# Patient Record
Sex: Female | Born: 1965
Health system: Southern US, Community
[De-identification: ages and names within clinical notes are randomized; demographics above are authoritative.]

## PROBLEM LIST (undated history)

## (undated) DIAGNOSIS — D259 Leiomyoma of uterus, unspecified: Secondary | ICD-10-CM

## (undated) DIAGNOSIS — Z973 Presence of spectacles and contact lenses: Secondary | ICD-10-CM

## (undated) DIAGNOSIS — T7840XA Allergy, unspecified, initial encounter: Secondary | ICD-10-CM

## (undated) DIAGNOSIS — Z9889 Other specified postprocedural states: Secondary | ICD-10-CM

## (undated) DIAGNOSIS — M1990 Unspecified osteoarthritis, unspecified site: Secondary | ICD-10-CM

## (undated) DIAGNOSIS — N840 Polyp of corpus uteri: Secondary | ICD-10-CM

## (undated) DIAGNOSIS — F411 Generalized anxiety disorder: Secondary | ICD-10-CM

## (undated) DIAGNOSIS — R112 Nausea with vomiting, unspecified: Secondary | ICD-10-CM

## (undated) DIAGNOSIS — M9909 Segmental and somatic dysfunction of abdomen and other regions: Secondary | ICD-10-CM

## (undated) DIAGNOSIS — J302 Other seasonal allergic rhinitis: Secondary | ICD-10-CM

## (undated) DIAGNOSIS — B019 Varicella without complication: Secondary | ICD-10-CM

## (undated) DIAGNOSIS — Z87440 Personal history of urinary (tract) infections: Secondary | ICD-10-CM

## (undated) DIAGNOSIS — M2550 Pain in unspecified joint: Secondary | ICD-10-CM

## (undated) DIAGNOSIS — M8580 Other specified disorders of bone density and structure, unspecified site: Secondary | ICD-10-CM

## (undated) DIAGNOSIS — T148XXA Other injury of unspecified body region, initial encounter: Secondary | ICD-10-CM

## (undated) DIAGNOSIS — D509 Iron deficiency anemia, unspecified: Secondary | ICD-10-CM

## (undated) HISTORY — DX: Unspecified osteoarthritis, unspecified site: M19.90

## (undated) HISTORY — PX: COLONOSCOPY: SHX174

## (undated) HISTORY — DX: Other injury of unspecified body region, initial encounter: T14.8XXA

## (undated) HISTORY — DX: Varicella without complication: B01.9

## (undated) HISTORY — DX: Other specified postprocedural states: Z98.890

## (undated) HISTORY — DX: Nausea with vomiting, unspecified: R11.2

## (undated) HISTORY — PX: TONSILLECTOMY: SUR1361

## (undated) HISTORY — PX: HYSTEROSCOPY WITH RESECTOSCOPE: SHX5395

## (undated) HISTORY — DX: Other specified disorders of bone density and structure, unspecified site: M85.80

## (undated) HISTORY — PX: BREAST CYST ASPIRATION: SHX578

## (undated) HISTORY — DX: Personal history of urinary (tract) infections: Z87.440

## (undated) HISTORY — DX: Allergy, unspecified, initial encounter: T78.40XA

## (undated) HISTORY — PX: BREAST SURGERY: SHX581

---

## 2006-02-18 HISTORY — PX: BREAST LUMPECTOMY: SHX2

## 2006-02-18 HISTORY — PX: BREAST BIOPSY: SHX20

## 2006-11-20 LAB — LIPID PANEL
Cholesterol: 161 mg/dL (ref 0–200)
HDL: 63 mg/dL (ref 35–70)
LDL Cholesterol: 81 mg/dL
Triglycerides: 87 mg/dL (ref 40–160)

## 2006-11-20 LAB — BASIC METABOLIC PANEL
BUN: 12 mg/dL (ref 4–21)
CREATININE: 0.8 mg/dL (ref <=1.1)
POTASSIUM: 4.6 mmol/L (ref 3.4–5.3)
Sodium: 138 mmol/L (ref 137–147)

## 2007-01-08 LAB — HEPATIC FUNCTION PANEL
ALT: 55 U/L — AB (ref 7–35)
AST: 35 U/L (ref 13–35)
Alkaline Phosphatase: 44 U/L (ref 25–125)
BILIRUBIN, TOTAL: 0.6 mg/dL

## 2009-02-18 HISTORY — PX: LAPAROSCOPIC GELPORT ASSISTED MYOMECTOMY: SHX6549

## 2009-02-18 HISTORY — PX: ROBOT ASSISTED MYOMECTOMY: SHX5142

## 2010-07-13 LAB — TSH: TSH: 4.17 u[IU]/mL (ref 0.41–5.90)

## 2010-07-13 LAB — BASIC METABOLIC PANEL
BUN: 15 mg/dL (ref 4–21)
Creatinine: 0.8 mg/dL (ref 0.5–1.1)
POTASSIUM: 4.4 mmol/L (ref 3.4–5.3)
Sodium: 136 mmol/L — AB (ref 137–147)

## 2010-07-13 LAB — CBC AND DIFFERENTIAL
HEMATOCRIT: 39 % (ref 36–46)
Hemoglobin: 13.2 g/dL (ref 12.0–16.0)
PLATELETS: 156 10*3/uL (ref 150–399)
WBC: 4 10*3/mL

## 2010-07-13 LAB — HEPATIC FUNCTION PANEL
ALT: 27 U/L (ref 7–35)
AST: 30 U/L (ref 13–35)
Alkaline Phosphatase: 51 U/L (ref 25–125)

## 2011-10-01 LAB — HEPATIC FUNCTION PANEL
ALT: 19 U/L (ref 7–35)
AST: 24 U/L (ref 13–35)
Alkaline Phosphatase: 56 U/L (ref 25–125)
BILIRUBIN, TOTAL: 0.7 mg/dL

## 2011-10-01 LAB — CBC AND DIFFERENTIAL
HEMATOCRIT: 43 % (ref 36–46)
HEMOGLOBIN: 14.3 g/dL (ref 12.0–16.0)
Platelets: 184 10*3/uL (ref 150–399)
WBC: 4.2 10^3/mL

## 2011-10-01 LAB — BASIC METABOLIC PANEL
BUN: 12 mg/dL (ref 4–21)
Creatinine: 0.9 mg/dL (ref 0.5–1.1)
GLUCOSE: 89 mg/dL
POTASSIUM: 4.4 mmol/L (ref 3.4–5.3)
SODIUM: 143 mmol/L (ref 137–147)

## 2011-10-01 LAB — LIPID PANEL
Cholesterol: 146 mg/dL (ref 0–200)
HDL: 51 mg/dL (ref 35–70)
LDL CALC: 83 mg/dL
TRIGLYCERIDES: 60 mg/dL (ref 40–160)

## 2012-02-17 LAB — HEPATIC FUNCTION PANEL
ALK PHOS: 45 U/L (ref 25–125)
ALT: 16 U/L (ref 7–35)
AST: 26 U/L (ref 13–35)
Bilirubin, Total: 0.7 mg/dL

## 2012-02-17 LAB — CBC AND DIFFERENTIAL
HEMATOCRIT: 41 % (ref 36–46)
HEMOGLOBIN: 13.6 g/dL (ref 12.0–16.0)
Platelets: 193 10*3/uL (ref 150–399)
WBC: 4.6 10^3/mL

## 2012-02-17 LAB — BASIC METABOLIC PANEL
BUN: 12 mg/dL (ref 4–21)
Creatinine: 0.9 mg/dL (ref 0.5–1.1)
Glucose: 82 mg/dL
Potassium: 4 mmol/L (ref 3.4–5.3)
Sodium: 139 mmol/L (ref 137–147)

## 2012-02-19 HISTORY — PX: MENISCUS REPAIR: SHX5179

## 2012-02-19 HISTORY — PX: KNEE ARTHROSCOPY W/ MENISCAL REPAIR: SHX1877

## 2012-09-02 LAB — CBC AND DIFFERENTIAL
HCT: 42 % (ref 36–46)
Hemoglobin: 14.6 g/dL (ref 12.0–16.0)
Platelets: 200 10*3/uL (ref 150–399)
WBC: 5 10^3/mL

## 2012-09-03 LAB — BASIC METABOLIC PANEL
BUN: 9 mg/dL (ref 4–21)
CREATININE: 0.8 mg/dL (ref 0.5–1.1)
GLUCOSE: 83 mg/dL
Potassium: 4 mmol/L (ref 3.4–5.3)
SODIUM: 135 mmol/L — AB (ref 137–147)

## 2012-09-03 LAB — LIPID PANEL
Cholesterol: 160 mg/dL (ref 0–200)
HDL: 65 mg/dL (ref 35–70)
LDL Cholesterol: 81 mg/dL
Triglycerides: 68 mg/dL (ref 40–160)

## 2012-09-03 LAB — HEPATIC FUNCTION PANEL
ALT: 17 U/L (ref 7–35)
AST: 20 U/L (ref 13–35)
Alkaline Phosphatase: 48 U/L (ref 25–125)
BILIRUBIN, TOTAL: 0.7 mg/dL

## 2012-09-03 LAB — TSH: TSH: 2.66 u[IU]/mL (ref 0.41–5.90)

## 2016-03-08 ENCOUNTER — Other Ambulatory Visit: Payer: Self-pay | Admitting: Chiropractic Medicine

## 2016-03-08 ENCOUNTER — Ambulatory Visit
Admission: RE | Admit: 2016-03-08 | Discharge: 2016-03-08 | Disposition: A | Discharge status: Home / Self Care | Payer: Managed Care, Other (non HMO) | Source: Ambulatory Visit | Attending: Chiropractic Medicine | Admitting: Chiropractic Medicine

## 2016-03-08 DIAGNOSIS — Z87828 Personal history of other (healed) physical injury and trauma: Secondary | ICD-10-CM

## 2016-04-24 ENCOUNTER — Ambulatory Visit (INDEPENDENT_AMBULATORY_CARE_PROVIDER_SITE_OTHER): Payer: Managed Care, Other (non HMO) | Admitting: Family Medicine

## 2016-04-24 ENCOUNTER — Encounter: Payer: Self-pay | Admitting: Family Medicine

## 2016-04-24 VITALS — BP 100/70 | HR 87 | Resp 12 | Ht 67.0 in | Wt 165.5 lb

## 2016-04-24 DIAGNOSIS — Z7681 Expectant parent(s) prebirth pediatrician visit: Secondary | ICD-10-CM | POA: Diagnosis not present

## 2016-04-24 DIAGNOSIS — Z131 Encounter for screening for diabetes mellitus: Secondary | ICD-10-CM

## 2016-04-24 DIAGNOSIS — Z0282 Encounter for adoption services: Secondary | ICD-10-CM

## 2016-04-24 DIAGNOSIS — Z0000 Encounter for general adult medical examination without abnormal findings: Secondary | ICD-10-CM

## 2016-04-24 DIAGNOSIS — J309 Allergic rhinitis, unspecified: Secondary | ICD-10-CM | POA: Insufficient documentation

## 2016-04-24 DIAGNOSIS — Z1211 Encounter for screening for malignant neoplasm of colon: Secondary | ICD-10-CM

## 2016-04-24 DIAGNOSIS — Z13220 Encounter for screening for lipoid disorders: Secondary | ICD-10-CM | POA: Diagnosis not present

## 2016-04-24 DIAGNOSIS — Z114 Encounter for screening for human immunodeficiency virus [HIV]: Secondary | ICD-10-CM | POA: Diagnosis not present

## 2016-04-24 DIAGNOSIS — R5383 Other fatigue: Secondary | ICD-10-CM | POA: Diagnosis not present

## 2016-04-24 LAB — LIPID PANEL
CHOLESTEROL: 158 mg/dL (ref 0–200)
HDL: 58.9 mg/dL (ref >39.00)
LDL CALC: 87 mg/dL (ref 0–99)
NONHDL: 99.43
Total CHOL/HDL Ratio: 3
Triglycerides: 62 mg/dL (ref 0.0–149.0)
VLDL: 12.4 mg/dL (ref 0.0–40.0)

## 2016-04-24 LAB — CBC
HEMATOCRIT: 41.4 % (ref 36.0–46.0)
HEMOGLOBIN: 14.1 g/dL (ref 12.0–15.0)
MCHC: 34.1 g/dL (ref 30.0–36.0)
MCV: 91.5 fl (ref 78.0–100.0)
Platelets: 206 10*3/uL (ref 150.0–400.0)
RBC: 4.52 Mil/uL (ref 3.87–5.11)
RDW: 13.4 % (ref 11.5–15.5)
WBC: 4.3 10*3/uL (ref 4.0–10.5)

## 2016-04-24 LAB — URINALYSIS, ROUTINE W REFLEX MICROSCOPIC
BILIRUBIN URINE: NEGATIVE
HGB URINE DIPSTICK: NEGATIVE
Ketones, ur: NEGATIVE
LEUKOCYTES UA: NEGATIVE
NITRITE: NEGATIVE
Specific Gravity, Urine: <=1.005 — AB (ref 1.000–1.030)
TOTAL PROTEIN, URINE-UPE24: NEGATIVE
URINE GLUCOSE: NEGATIVE
UROBILINOGEN UA: 0.2 (ref 0.0–1.0)
pH: 7 (ref 5.0–8.0)

## 2016-04-24 LAB — TSH: TSH: 3.06 u[IU]/mL (ref 0.35–4.50)

## 2016-04-24 LAB — BASIC METABOLIC PANEL
BUN: 9 mg/dL (ref 6–23)
CALCIUM: 9.5 mg/dL (ref 8.4–10.5)
CO2: 28 mEq/L (ref 19–32)
Chloride: 104 mEq/L (ref 96–112)
Creatinine, Ser: 0.76 mg/dL (ref 0.40–1.20)
GFR: 85.65 mL/min (ref >60.00)
GLUCOSE: 83 mg/dL (ref 70–99)
POTASSIUM: 4.5 meq/L (ref 3.5–5.1)
SODIUM: 137 meq/L (ref 135–145)

## 2016-04-24 MED ORDER — FLUTICASONE PROPIONATE 50 MCG/ACT NA SUSP: 1.0000 | Freq: Two times a day (BID) | NASAL | 3 refills | Status: DC

## 2016-04-24 NOTE — Progress Notes (Signed)
Pre visit review using our clinic review tool, if applicable. No additional management support is needed unless otherwise documented below in the visit note. 

## 2016-04-24 NOTE — Progress Notes (Signed)
HPI:   Karen Short is a 51 y.o. female, who is here today to establish care. She also needs a physical and form fill out + lab work to start adoption process.   Former PCP: N/A. She relocated in 11/2015,moved from [place]. Last preventive routine visit: 2-3 years ago. Last pap smear 02/2015.  She follows with gyn regularly for her routine female preventive care.  She lives with her wife and 4 yo daughter (adopted).   Chronic medical problems: Negative.   Concerns today:  Frequent illness: She is concerned because since 11/2015 has had frequent sore throat, post nasal drainage, and non productive cough. + Fatigue. She has Hx of sesonal allergies but has not been symptomatic in a while.  "Always" feeling cold. Denies fever,chills, headache, chest pain, dyspnea, wheezing, or abnormal wt loss.  She tries to follow a healthy diet and exercises regularly. She takes several OTC supplements and vitamins.   Review of Systems  Constitutional: Positive for fatigue. Negative for activity change, appetite change, chills, fever and unexpected weight change.  HENT: Positive for postnasal drip and rhinorrhea. Negative for dental problem, hearing loss, mouth sores, sinus pain, trouble swallowing and voice change.   Eyes: Negative for redness and visual disturbance.  Respiratory: Positive for cough. Negative for shortness of breath and wheezing.   Cardiovascular: Negative for chest pain, palpitations and leg swelling.  Gastrointestinal: Negative for abdominal pain, nausea and vomiting.       No changes in bowel habits.  Endocrine: Positive for cold intolerance. Negative for heat intolerance, polydipsia, polyphagia and polyuria.  Genitourinary: Negative for decreased urine volume, dysuria and hematuria.  Musculoskeletal: Positive for arthralgias (chronic). Negative for gait problem and neck pain.  Skin: Negative for color change and rash.  Allergic/Immunologic: Positive  for environmental allergies.  Neurological: Negative for syncope, weakness and headaches.  Hematological: Negative for adenopathy. Does not bruise/bleed easily.  Psychiatric/Behavioral: Negative for confusion and sleep disturbance. The patient is not nervous/anxious.   All other systems reviewed and are negative.     No current outpatient prescriptions on file prior to visit.   No current facility-administered medications on file prior to visit.      Past Medical History:  Diagnosis Date  . Allergy   . Arthritis   . Chicken pox   . History of frequent urinary tract infections    Not on File  Family History  Problem Relation Age of Onset  . Arthritis Mother   . Hyperlipidemia Mother   . Heart disease Mother   . Stroke Mother   . Hypertension Mother   . Diabetes Mother   . Hyperlipidemia Father   . Heart disease Father   . Stroke Father   . Hypertension Father     Social History   Social History  . Marital status: Married    Spouse name: N/A  . Number of children: N/A  . Years of education: N/A   Social History Main Topics  . Smoking status: Former Games developer  . Smokeless tobacco: Never Used  . Alcohol use Yes  . Drug use: No  . Sexual activity: Yes    Birth control/ protection: None   Other Topics Concern  . None   Social History Narrative  . None    Vitals:   04/24/16 0759  BP: 100/70  Pulse: 87  Resp: 12   O2 sat at RA 98% Body mass index is 25.92 kg/m.   Physical Exam  Nursing note  and vitals reviewed. Constitutional: She is oriented to person, place, and time. She appears well-developed and well-nourished. No distress.  HENT:  Head: Atraumatic.  Right Ear: Hearing, tympanic membrane, external ear and ear canal normal.  Left Ear: Hearing, tympanic membrane, external ear and ear canal normal.  Mouth/Throat: Uvula is midline, oropharynx is clear and moist and mucous membranes are normal.  Eyes: Conjunctivae and EOM are normal. Pupils are  equal, round, and reactive to light.  Neck: No tracheal deviation present. No thyroid mass and no thyromegaly present.  Cardiovascular: Normal rate and regular rhythm.   No murmur heard. Pulses:      Dorsalis pedis pulses are 2+ on the right side, and 2+ on the left side.  Respiratory: Effort normal and breath sounds normal. No respiratory distress.  GI: Soft. She exhibits no mass. There is no hepatomegaly. There is no tenderness.  Musculoskeletal: She exhibits no edema or tenderness.  No major deformity or signs of synovitis appreciated.  Lymphadenopathy:    She has no cervical adenopathy.       Right: No supraclavicular adenopathy present.       Left: No supraclavicular adenopathy present.  Neurological: She is alert and oriented to person, place, and time. She has normal strength. No cranial nerve deficit. Coordination and gait normal.  Reflex Scores:      Bicep reflexes are 2+ on the right side and 2+ on the left side.      Patellar reflexes are 2+ on the right side and 2+ on the left side. Skin: Skin is warm. No rash noted. No erythema.  Psychiatric: She has a normal mood and affect. Her speech is normal.  Well groomed, good eye contact.      ASSESSMENT AND PLAN:   Karen Short was seen today for establish care.  Diagnoses and all orders for this visit:  Routine physical examination   We discussed the importance of regular physical activity and healthy diet for prevention of chronic illness and/or complications. Preventive guidelines reviewed. Vaccination up to date. Keep appt with gyn. Aspirin for primary prevention discussed, side effects also reviewed. Ca++ and vit D supplementation recommended. Advise caution with OTC supplements. Next CPE in 1 year.   -     TSH -     CBC -     Lipid panel -     Basic metabolic panel  Colon cancer screening -     Ambulatory referral to Gastroenterology  Lipid screening -     Lipid panel  Diabetes mellitus screening -      Basic metabolic panel  Encounter for screening for HIV -     HIV antibody (with reflex)  Pre-adoption visit for adoptive parent  Form filled out. Labs ordered.  -     Hepatitis B Surface AntiGEN -     Quantiferon tb gold assay -     Urinalysis, Routine w reflex microscopic  Allergic rhinitis, unspecified chronicity, unspecified seasonality, unspecified trigger  Hx suggest allergic rhinitis.I do not think CXR is needed at this time. She was instructed to try Flonase nasal spray and OTC antihistaminic. Instructed to let me know in 4-6 weeks if she is still having symptoms, in which case Singulair could be considered.   -     fluticasone (FLONASE) 50 MCG/ACT nasal spray; Place 1 spray into both nostrils 2 (two) times daily.  Other fatigue  Could also be related to allergies. Further recommendations will be given according to labs results.   -  TSH -     CBC      Karen Zuk G. [place], MD  [facility]. [place] office.

## 2016-04-24 NOTE — Patient Instructions (Addendum)
A few things to remember from today's visit:   Routine physical examination - Plan: TSH, CBC, Lipid panel, Basic metabolic panel  Colon cancer screening - Plan: Ambulatory referral to Gastroenterology  Lipid screening - Plan: Lipid panel  Diabetes mellitus screening - Plan: Basic metabolic panel  Encounter for screening for HIV - Plan: HIV antibody (with reflex)    At least 150 minutes of moderate exercise per week, daily brisk walking for 15-30 min is a good exercise option. Healthy diet low in saturated (animal) fats and sweets and consisting of fresh fruits and vegetables, lean meats such as fish and white chicken and whole grains.   - Vaccines:  Tdap vaccine every 10 years.  Shingles vaccine recommended at age 560, could be given after 50 years of age but not sure about insurance coverage.  Pneumonia vaccines:  Prevnar 13 at 65 and Pneumovax at 66.  Screening recommendations for low/normal risk women:  Screening for diabetes at age 50-45 and every 3 years.  Cervical cancer prevention:  -HPV vaccination between 439-50 years old. -Pap smear starts at 50 years of age and continues periodically until 50 years old in low risk women. Pap smear every 3 years between 5421 and 50 years old. Pap smear every 3 years between women 30 and older if pap smear negative and HPV screening negative.   -Breast cancer: Mammogram: There is disagreement between experts about when to start screening in low risk asymptomatic female but recent recommendations are to start screening at 5340 and not later than 50 years old , every 1-2 years and after 50 yo q 2 years. Screening is recommended until 50 years old but some women can continue screening depending of healthy issues.   Colon cancer screening: starts at 50 years old until 50 years old.  Cholesterol disorder screening at age 50 and every 3 years.    Please be sure medication list is accurate. If a new problem present, please set up  appointment sooner than planned today.

## 2016-04-25 LAB — HIV ANTIBODY (ROUTINE TESTING W REFLEX): HIV 1&2 Ab, 4th Generation: NONREACTIVE

## 2016-04-25 LAB — HEPATITIS B SURFACE ANTIGEN: HEP B S AG: NEGATIVE

## 2016-04-26 LAB — QUANTIFERON TB GOLD ASSAY (BLOOD)
Interferon Gamma Release Assay: NEGATIVE
Quantiferon Nil Value: 0.02 IU/mL
Quantiferon Tb Ag Minus Nil Value: 0 IU/mL

## 2016-04-28 ENCOUNTER — Encounter: Payer: Self-pay | Admitting: Family Medicine

## 2016-05-03 ENCOUNTER — Telehealth: Payer: Self-pay | Admitting: Family Medicine

## 2016-05-03 DIAGNOSIS — J309 Allergic rhinitis, unspecified: Secondary | ICD-10-CM

## 2016-05-03 MED ORDER — FLUTICASONE PROPIONATE 50 MCG/ACT NA SUSP: 1.0000 | Freq: Two times a day (BID) | NASAL | 1 refills | Status: DC

## 2016-05-03 NOTE — Telephone Encounter (Signed)
Rx printed

## 2016-05-03 NOTE — Telephone Encounter (Signed)
Pt would like new rx flonase #3 for 90 day supply to [facility] to [facility]. Pt would like to pick up rx in about 1 hour

## 2016-05-03 NOTE — Telephone Encounter (Signed)
Rx placed up front for patient to pick up

## 2016-05-27 ENCOUNTER — Encounter: Payer: Self-pay | Admitting: Family Medicine

## 2016-07-02 ENCOUNTER — Encounter: Payer: Self-pay | Admitting: Gastroenterology

## 2016-07-09 ENCOUNTER — Other Ambulatory Visit: Payer: Self-pay | Admitting: Obstetrics and Gynecology

## 2016-07-09 DIAGNOSIS — N6320 Unspecified lump in the left breast, unspecified quadrant: Secondary | ICD-10-CM

## 2016-07-19 ENCOUNTER — Ambulatory Visit
Admission: RE | Admit: 2016-07-19 | Discharge: 2016-07-19 | Disposition: A | Discharge status: Home / Self Care | Payer: Managed Care, Other (non HMO) | Source: Ambulatory Visit | Attending: Obstetrics and Gynecology | Admitting: Obstetrics and Gynecology

## 2016-07-19 ENCOUNTER — Other Ambulatory Visit: Payer: Self-pay | Admitting: Obstetrics and Gynecology

## 2016-07-19 DIAGNOSIS — N6310 Unspecified lump in the right breast, unspecified quadrant: Secondary | ICD-10-CM

## 2016-07-19 DIAGNOSIS — N6320 Unspecified lump in the left breast, unspecified quadrant: Secondary | ICD-10-CM

## 2016-08-26 ENCOUNTER — Other Ambulatory Visit: Payer: Self-pay | Admitting: Obstetrics and Gynecology

## 2016-08-26 DIAGNOSIS — Z803 Family history of malignant neoplasm of breast: Secondary | ICD-10-CM

## 2016-09-05 ENCOUNTER — Ambulatory Visit (AMBULATORY_SURGERY_CENTER): Payer: Self-pay | Admitting: *Deleted

## 2016-09-05 VITALS — Ht 67.0 in | Wt 166.8 lb

## 2016-09-05 DIAGNOSIS — Z1211 Encounter for screening for malignant neoplasm of colon: Secondary | ICD-10-CM

## 2016-09-05 MED ORDER — NA SULFATE-K SULFATE-MG SULF 17.5-3.13-1.6 GM/177ML PO SOLN: 1.0000 | Freq: Once | ORAL | 0 refills | Status: AC

## 2016-09-05 NOTE — Progress Notes (Signed)
No egg or soy allergy known to patient  No issues with past sedation with any surgeries  or procedures, no intubation problems  No diet pills per patient No home 02 use per patient  No blood thinners per patient  Pt denies issues with constipation  No A fib or A flutter  EMMI video sent to pt's e mail  $15 coupon to pt for suprep in [place] today   

## 2016-09-09 ENCOUNTER — Encounter: Payer: Self-pay | Admitting: Gastroenterology

## 2016-09-20 ENCOUNTER — Encounter: Payer: Managed Care, Other (non HMO) | Admitting: Gastroenterology

## 2016-11-07 ENCOUNTER — Encounter: Payer: Self-pay | Admitting: Family Medicine

## 2016-11-07 ENCOUNTER — Ambulatory Visit
Admission: RE | Admit: 2016-11-07 | Discharge: 2016-11-07 | Disposition: A | Discharge status: Home / Self Care | Payer: Managed Care, Other (non HMO) | Source: Ambulatory Visit | Attending: Obstetrics and Gynecology | Admitting: Obstetrics and Gynecology

## 2016-11-07 DIAGNOSIS — Z803 Family history of malignant neoplasm of breast: Secondary | ICD-10-CM

## 2016-11-07 MED ORDER — GADOBENATE DIMEGLUMINE 529 MG/ML IV SOLN
15.0000 mL | Freq: Once | INTRAVENOUS | Status: AC | PRN
  Administered 2016-11-07: 15 mL via INTRAVENOUS

## 2017-02-18 HISTORY — PX: COLONOSCOPY: SHX174

## 2017-03-27 LAB — HM COLONOSCOPY

## 2017-04-04 ENCOUNTER — Encounter: Payer: Self-pay | Admitting: Family Medicine

## 2017-07-25 ENCOUNTER — Encounter: Payer: Self-pay | Admitting: Family Medicine

## 2017-07-25 ENCOUNTER — Ambulatory Visit (INDEPENDENT_AMBULATORY_CARE_PROVIDER_SITE_OTHER): Payer: Managed Care, Other (non HMO) | Admitting: Family Medicine

## 2017-07-25 VITALS — BP 106/66 | HR 76 | Temp 98.2°F | Resp 14 | Ht 66.5 in | Wt 172.4 lb

## 2017-07-25 DIAGNOSIS — Z0000 Encounter for general adult medical examination without abnormal findings: Secondary | ICD-10-CM | POA: Diagnosis not present

## 2017-07-25 DIAGNOSIS — Z13220 Encounter for screening for lipoid disorders: Secondary | ICD-10-CM | POA: Diagnosis not present

## 2017-07-25 DIAGNOSIS — J302 Other seasonal allergic rhinitis: Secondary | ICD-10-CM

## 2017-07-25 DIAGNOSIS — Z131 Encounter for screening for diabetes mellitus: Secondary | ICD-10-CM | POA: Diagnosis not present

## 2017-07-25 LAB — BASIC METABOLIC PANEL
BUN: 12 mg/dL (ref 6–23)
CALCIUM: 9.4 mg/dL (ref 8.4–10.5)
CO2: 26 mEq/L (ref 19–32)
Chloride: 101 mEq/L (ref 96–112)
Creatinine, Ser: 0.8 mg/dL (ref 0.40–1.20)
GFR: 80.32 mL/min (ref >60.00)
GLUCOSE: 89 mg/dL (ref 70–99)
Potassium: 4.5 mEq/L (ref 3.5–5.1)
SODIUM: 135 meq/L (ref 135–145)

## 2017-07-25 LAB — LIPID PANEL
CHOLESTEROL: 150 mg/dL (ref 0–200)
HDL: 51 mg/dL (ref >39.00)
LDL Cholesterol: 84 mg/dL (ref 0–99)
NonHDL: 99.35
Total CHOL/HDL Ratio: 3
Triglycerides: 77 mg/dL (ref 0.0–149.0)
VLDL: 15.4 mg/dL (ref 0.0–40.0)

## 2017-07-25 MED ORDER — FLUTICASONE PROPIONATE 50 MCG/ACT NA SUSP: 1.0000 | Freq: Two times a day (BID) | NASAL | 6 refills | Status: DC | PRN

## 2017-07-25 NOTE — Patient Instructions (Addendum)
A few things to remember from today's visit:   Diabetes mellitus screening - Plan: Basic metabolic panel  Screening for lipid disorders - Plan: Lipid panel  Allergic rhinitis - Plan: fluticasone (FLONASE) 50 MCG/ACT nasal spray  Routine general medical examination at a health care facility  Today you have you routine preventive visit.  At least 150 minutes of moderate exercise per week, daily brisk walking for 15-30 min is a good exercise option. Healthy diet low in saturated (animal) fats and sweets and consisting of fresh fruits and vegetables, lean meats such as fish and white chicken and whole grains.  These are some of recommendations for screening depending of age and risk factors:   - Vaccines:  Tdap vaccine every 10 years.  Shingles vaccine recommended at age 51, could be given after 51 years of age but not sure about insurance coverage.   Pneumonia vaccines:  Prevnar 13 at 65 and Pneumovax at 66. Sometimes Pneumovax is giving earlier if history of smoking, lung disease,diabetes,kidney disease among some.    Screening for diabetes at age 51 and every 3 years.  Cervical cancer prevention:  Pap smear starts at 10621 years of age and continues periodically until 51 years old in low risk women. Pap smear every 3 years between 8821 and 10923 years old. Pap smear every 3-5 years between women 30 and older if pap smear negative and HPV screening negative.   -Breast cancer: Mammogram: There is disagreement between experts about when to start screening in low risk asymptomatic female but recent recommendations are to start screening at 5040 and not later than 51 years old , every 1-2 years and after 51 yo q 2 years. Screening is recommended until 51 years old but some women can continue screening depending of healthy issues.   Colon cancer screening: starts at 51 years old until 51 years old.  Cholesterol disorder screening at age 51 and every 3 years.  Also recommended:  1. Dental  visit- Brush and floss your teeth twice daily; visit your dentist twice a year. 2. Eye doctor- Get an eye exam at least every 2 years. 3. Helmet use- Always wear a helmet when riding a bicycle, motorcycle, rollerblading or skateboarding. 4. Safe sex- If you may be exposed to sexually transmitted infections, use a condom. 5. Seat belts- Seat belts can save your live; always wear one. 6. Smoke/Carbon Monoxide detectors- These detectors need to be installed on the appropriate level of your home. Replace batteries at least once a year. 7. Skin cancer- When out in the sun please cover up and use sunscreen 15 SPF or higher. 8. Violence- If anyone is threatening or hurting you, please tell your healthcare provider.  9. Drink alcohol in moderation- Limit alcohol intake to one drink or less per day. Never drink and drive.   Please be sure medication list is accurate. If a new problem present, please set up appointment sooner than planned today.

## 2017-07-25 NOTE — Progress Notes (Signed)
HPI:   [name] is a 52 y.o. female, who is here today for her routine physical.  Last CPE: 04/2016.  She follows with her gyn regularly.  Regular exercise 3 or more time per week: Walking 30 min 3-4 times per week.  Following a healthy diet: Not consistently. She has noted some wt gain.  She lives with her wife and 71 years old daughter (adopted).   Pap smear Last week.   Immunization History  Administered Date(s) Administered  . Influenza Inj Mdck Quad Pf 12/18/2016    Mammogram: MRI 10/2016 Bi-Rads 2. She had one last week at her gyn's office.  Colonoscopy: 03/27/2017 DEXA: N/A   She has no concerns today.  Allergic rhinitis: Flonase nasal spray helping with symptoms.  Review of Systems  Constitutional: Negative for appetite change, fatigue and fever.  HENT: Negative for dental problem, hearing loss, mouth sores, sore throat, trouble swallowing and voice change.   Eyes: Negative for redness and visual disturbance.  Respiratory: Negative for cough, shortness of breath and wheezing.   Cardiovascular: Negative for chest pain and leg swelling.  Gastrointestinal: Negative for abdominal pain, nausea and vomiting.       No changes in bowel habits.  Endocrine: Negative for cold intolerance, heat intolerance, polydipsia, polyphagia and polyuria.  Genitourinary: Negative for decreased urine volume, dysuria, hematuria, vaginal bleeding and vaginal discharge.  Musculoskeletal: Negative for arthralgias, back pain and neck pain.  Skin: Negative for color change and rash.  Allergic/Immunologic: Positive for environmental allergies.  Neurological: Negative for syncope, weakness and headaches.  Hematological: Negative for adenopathy. Does not bruise/bleed easily.  Psychiatric/Behavioral: Negative for confusion and sleep disturbance. The patient is not nervous/anxious.   All other systems reviewed and are negative.     Current Outpatient Medications on File  Prior to Visit  Medication Sig Dispense Refill  . Multiple Vitamin (DAILY VITAMIN) tablet Take 1 tablet by mouth.     No current facility-administered medications on file prior to visit.      Past Medical History:  Diagnosis Date  . Allergy   . Arthritis   . Chicken pox   . History of frequent urinary tract infections   . Osteopenia   . PONV (postoperative nausea and vomiting)     Past Surgical History:  Procedure Laterality Date  . BREAST BIOPSY Left 2008   U/S Core- Benign  . BREAST CYST ASPIRATION Left   . BREAST CYST ASPIRATION Right   . BREAST SURGERY    . COLONOSCOPY     30 + yrs in [place]   . MENISCUS REPAIR Right 2014  . ROBOT ASSISTED MYOMECTOMY  2011  . TONSILLECTOMY      Allergies  Allergen Reactions  . Epinephrine     Lightheaded, things spin and "start to go dark", rapid HR per patient.  . Latex Rash    Family History  Problem Relation Age of Onset  . Arthritis Mother   . Hyperlipidemia Mother   . Heart disease Mother   . Stroke Mother   . Hypertension Mother   . Diabetes Mother   . Hyperlipidemia Father   . Heart disease Father   . Stroke Father   . Hypertension Father   . Breast cancer Maternal Aunt   . Breast cancer Paternal Aunt   . Colon polyps Neg Hx   . Colon cancer Neg Hx   . Esophageal cancer Neg Hx   . Rectal cancer Neg Hx   .  Stomach cancer Neg Hx     Social History   Socioeconomic History  . Marital status: Married    Spouse name: Not on file  . Number of children: Not on file  . Years of education: Not on file  . Highest education level: Not on file  Occupational History  . Not on file  Social Needs  . Financial resource strain: Not on file  . Food insecurity:    Worry: Not on file    Inability: Not on file  . Transportation needs:    Medical: Not on file    Non-medical: Not on file  Tobacco Use  . Smoking status: Former Games developer  . Smokeless tobacco: Never Used  Substance and Sexual Activity  . Alcohol use: Yes     Comment: social  . Drug use: No  . Sexual activity: Not Currently    Birth control/protection: None  Lifestyle  . Physical activity:    Days per week: Not on file    Minutes per session: Not on file  . Stress: Not on file  Relationships  . Social connections:    Talks on phone: Not on file    Gets together: Not on file    Attends religious service: Not on file    Active member of club or organization: Not on file    Attends meetings of clubs or organizations: Not on file    Relationship status: Not on file  Other Topics Concern  . Not on file  Social History Narrative  . Not on file     Vitals:   07/25/17 0746  BP: 106/66  Pulse: 76  Resp: 14  Temp: 98.2 F (36.8 C)  SpO2: 98%   Body mass index is 27.41 kg/m.   Wt Readings from Last 3 Encounters:  07/25/17 172 lb 6.4 oz (78.2 kg)  09/05/16 166 lb 12.8 oz (75.7 kg)  04/24/16 165 lb 8 oz (75.1 kg)      Physical Exam  Nursing note and vitals reviewed. Constitutional: She is oriented to person, place, and time. She appears well-developed. No distress.  HENT:  Head: Normocephalic and atraumatic.  Right Ear: Hearing, tympanic membrane, external ear and ear canal normal.  Left Ear: Hearing, tympanic membrane, external ear and ear canal normal.  Mouth/Throat: Uvula is midline, oropharynx is clear and moist and mucous membranes are normal.  Eyes: Pupils are equal, round, and reactive to light. Conjunctivae and EOM are normal.  Neck: No tracheal deviation present. No thyromegaly present.  Cardiovascular: Normal rate and regular rhythm.  No murmur heard. Pulses:      Dorsalis pedis pulses are 2+ on the right side, and 2+ on the left side.  Respiratory: Effort normal and breath sounds normal. No respiratory distress.  GI: Soft. She exhibits no mass. There is no hepatomegaly. There is no tenderness.  Genitourinary:  Genitourinary Comments: Deferred to gyn.  Musculoskeletal: She exhibits no edema.  No major deformity  or sing of synovitis appreciated.  Lymphadenopathy:    She has no cervical adenopathy.       Right: No supraclavicular adenopathy present.       Left: No supraclavicular adenopathy present.  Neurological: She is alert and oriented to person, place, and time. She has normal strength. No cranial nerve deficit. Coordination and gait normal.  Reflex Scores:      Bicep reflexes are 2+ on the right side and 2+ on the left side.      Patellar reflexes are 2+ on  the right side and 2+ on the left side. Skin: Skin is warm. No rash noted. No erythema.  Psychiatric: She has a normal mood and affect. Her speech is normal.  Well groomed, good eye contact.     ASSESSMENT AND PLAN:  [facility] annual physical examination.   Orders Placed This Encounter  Procedures  . Lipid panel  . Basic metabolic panel    Lab Results  Component Value Date   CHOL 150 07/25/2017   HDL 51.00 07/25/2017   LDLCALC 84 07/25/2017   TRIG 77.0 07/25/2017   CHOLHDL 3 07/25/2017   Lab Results  Component Value Date   CREATININE 0.80 07/25/2017   BUN 12 07/25/2017   NA 135 07/25/2017   K 4.5 07/25/2017   CL 101 07/25/2017   CO2 26 07/25/2017     Routine general medical examination at a health care facility  We discussed the importance of regular physical activity and healthy diet for prevention of chronic illness and/or complications. Preventive guidelines reviewed. Vaccination up to date. She will continue female preventive care with gyn. Ca++ and vit D supplementation recommended. Next CPE in a year.  The 10-year ASCVD risk score Denman George(Goff DC Montez HagemanJr., et al., 2013) is: 0.7%   Values used to calculate the score:     Age: [age] years     Sex: Female     Is Non-Hispanic African American: No     Diabetic: No     Tobacco smoker: No     Systolic Blood Pressure: 106 mmHg     Is BP treated: No     HDL Cholesterol: 51 mg/dL     Total Cholesterol: 150 mg/dL  Diabetes mellitus  screening -     Basic metabolic panel  Screening for lipid disorders -     Lipid panel  Allergic rhinitis  Well controled with Flonase nasal spray. F/U annually.  -     fluticasone (FLONASE) 50 MCG/ACT nasal spray; Place 1 spray into both nostrils 2 (two) times daily as needed.      Return in about 1 year (around 07/26/2018) for CPE.          Ladislaus Repsher G. [place], MD  [facility]. [place] office.

## 2017-07-27 ENCOUNTER — Encounter: Payer: Self-pay | Admitting: Family Medicine

## 2017-08-12 ENCOUNTER — Other Ambulatory Visit: Payer: Self-pay

## 2017-08-18 ENCOUNTER — Telehealth: Payer: Self-pay | Admitting: Family Medicine

## 2017-08-18 DIAGNOSIS — Z0279 Encounter for issue of other medical certificate: Secondary | ICD-10-CM

## 2017-08-18 NOTE — Telephone Encounter (Signed)
Pt dropped off Physical Form to be completed by provider.  Upon completion pt would like to be called at [phone] to pick it up.  Form was put in providers folder for completion.

## 2017-08-20 NOTE — Telephone Encounter (Signed)
Form placed on doctor's desk for completion, will call when form is complete and ready for pick up.

## 2017-09-08 NOTE — Telephone Encounter (Signed)
Left voicemail informing patient that form was ready pick-up. Form placed in file cabinet at front desk.

## 2017-09-12 NOTE — Telephone Encounter (Signed)
Form was picked up on 7/23 with a charge of $50.00 and pt request to be billed.  Pt received form from TES.  Zenon MayoSheena will send in charge entry for the form.

## 2017-09-15 ENCOUNTER — Other Ambulatory Visit: Payer: Self-pay

## 2017-09-15 DIAGNOSIS — I83893 Varicose veins of bilateral lower extremities with other complications: Secondary | ICD-10-CM

## 2017-10-17 ENCOUNTER — Other Ambulatory Visit: Payer: Self-pay

## 2017-10-17 ENCOUNTER — Ambulatory Visit (HOSPITAL_COMMUNITY)
Admission: RE | Admit: 2017-10-17 | Discharge: 2017-10-17 | Disposition: A | Discharge status: Home / Self Care | Payer: Managed Care, Other (non HMO) | Source: Ambulatory Visit | Attending: Vascular Surgery | Admitting: Vascular Surgery

## 2017-10-17 ENCOUNTER — Encounter

## 2017-10-17 ENCOUNTER — Ambulatory Visit: Payer: Managed Care, Other (non HMO) | Admitting: Vascular Surgery

## 2017-10-17 ENCOUNTER — Encounter: Payer: Self-pay | Admitting: Vascular Surgery

## 2017-10-17 VITALS — BP 109/76 | HR 65 | Temp 97.7°F | Resp 16 | Ht 66.5 in | Wt 175.0 lb

## 2017-10-17 DIAGNOSIS — I781 Nevus, non-neoplastic: Secondary | ICD-10-CM | POA: Diagnosis not present

## 2017-10-17 DIAGNOSIS — I83893 Varicose veins of bilateral lower extremities with other complications: Secondary | ICD-10-CM | POA: Diagnosis present

## 2017-10-17 NOTE — Progress Notes (Signed)
Patient ID: Karen Short, female   DOB: 05/04/1965, 52 y.o.   MRN: [id number]  Reason for Consult: New Patient (Initial Visit) (Bilat V V.  Had previious laser, stripping, and sclero procedure. )   Referred by [place], Betty G, MD  Subjective:     HPI:  Karen Short is a 52 y.o. female without significant vascular history.  She has undergone multiple venous procedures in her bilateral lower extremities beginning back in her 34s with ablations and stripping as well as sclerotherapy bilaterally.  She now presents today for recurrent swelling in her bilateral lower extremities with associated lightheadedness with standing.  She also has some spider veins at her ankles bilaterally which she is concerned about the cosmesis.  She does not have any other skin discoloration or wounds of her bilateral extremities.  Past Medical History:  Diagnosis Date  . Allergy   . Arthritis   . Chicken pox   . History of frequent urinary tract infections   . Osteopenia   . PONV (postoperative nausea and vomiting)    Family History  Problem Relation Age of Onset  . Arthritis Mother   . Hyperlipidemia Mother   . Heart disease Mother   . Stroke Mother   . Hypertension Mother   . Diabetes Mother   . Hyperlipidemia Father   . Heart disease Father   . Stroke Father   . Hypertension Father   . Breast cancer Maternal Aunt   . Breast cancer Paternal Aunt   . Colon polyps Neg Hx   . Colon cancer Neg Hx   . Esophageal cancer Neg Hx   . Rectal cancer Neg Hx   . Stomach cancer Neg Hx    Past Surgical History:  Procedure Laterality Date  . BREAST BIOPSY Left 2008   U/S Core- Benign  . BREAST CYST ASPIRATION Left   . BREAST CYST ASPIRATION Right   . BREAST SURGERY    . COLONOSCOPY     30 + yrs in [place]   . MENISCUS REPAIR Right 2014  . ROBOT ASSISTED MYOMECTOMY  2011  . TONSILLECTOMY      Short Social History:  Social History   Tobacco Use  . Smoking status: Former Games developer  .  Smokeless tobacco: Never Used  Substance Use Topics  . Alcohol use: Yes    Comment: social    Allergies  Allergen Reactions  . Epinephrine     Lightheaded, things spin and "start to go dark", rapid HR per patient.  . Latex Rash    Current Outpatient Medications  Medication Sig Dispense Refill  . fluticasone (FLONASE) 50 MCG/ACT nasal spray Place 1 spray into both nostrils 2 (two) times daily as needed. 16 g 6  . Multiple Vitamin (DAILY VITAMIN) tablet Take 1 tablet by mouth.     No current facility-administered medications for this visit.     Review of Systems  Constitutional:  Constitutional negative. HENT: HENT negative.  Eyes: Eyes negative.  Respiratory: Respiratory negative.  Cardiovascular: Positive for leg swelling.  GI: Gastrointestinal negative.  Musculoskeletal: Positive for leg pain.  Skin: Skin negative.  Neurological: Neurological negative. Hematologic: Hematologic/lymphatic negative.  Psychiatric: Psychiatric negative.        Objective:  Objective   Vitals:   10/17/17 1509  BP: 109/76  Pulse: 65  Resp: 16  Temp: 97.7 F (36.5 C)  TempSrc: Oral  SpO2: 99%  Weight: 175 lb (79.4 kg)  Height: 5' 6.5" (1.689 m)   Body mass  index is 27.82 kg/m.  Physical Exam  Constitutional: She is oriented to person, place, and time. She appears well-developed.  HENT:  Head: Normocephalic.  Eyes: Pupils are equal, round, and reactive to light.  Neck: Normal range of motion.  Cardiovascular: Normal rate.  Pulses:      Radial pulses are 2+ on the right side, and 2+ on the left side.       Dorsalis pedis pulses are 2+ on the right side, and 2+ on the left side.  Pulmonary/Chest: Effort normal.  Abdominal: Soft.  Musculoskeletal: She exhibits edema.  Areas of spider veins bilaterally medial ankles  Neurological: She is alert and oriented to person, place, and time.  Skin: Skin is warm and dry.  Psychiatric: She has a normal mood and affect. Judgment and  thought content normal.    Data: I have independently interpreted her venous reflux study today which demonstrates anterior saphenous veins only in her bilateral lower extremities on the right 0.33 cm on the left 0.3 cm.  There is reflux in the anterior saphenous vein on the right up to the 20 to 74 ms and on the left up to 1562 ms     Assessment/Plan:     52 year old female with history of bilateral lower extremity venous interventions now with recurrent swelling and telangiectasias consistent with C1 venous disease given that she has no evidence of insufficiency in any saphenous veins in her anterior saphenous veins are quite small.  She is concerned about the cosmesis her bilateral ankles and we have discussed sclerotherapy for which I will have Harriett SineNancy call her in the near future.  She demonstrates good understanding.     Maeola HarmanBrandon Christopher Katryna Tschirhart MD [facility]

## 2017-12-16 ENCOUNTER — Ambulatory Visit: Payer: Managed Care, Other (non HMO) | Admitting: *Deleted

## 2017-12-16 ENCOUNTER — Ambulatory Visit: Payer: Managed Care, Other (non HMO)

## 2018-01-27 ENCOUNTER — Ambulatory Visit: Payer: Managed Care, Other (non HMO) | Admitting: *Deleted

## 2018-02-02 ENCOUNTER — Encounter: Payer: Self-pay | Admitting: *Deleted

## 2018-02-02 ENCOUNTER — Ambulatory Visit (INDEPENDENT_AMBULATORY_CARE_PROVIDER_SITE_OTHER): Payer: Self-pay | Admitting: *Deleted

## 2018-02-02 DIAGNOSIS — I781 Nevus, non-neoplastic: Secondary | ICD-10-CM

## 2018-02-02 NOTE — Progress Notes (Signed)
X=.3% Sotradecol administered with a 27g butterfly.  Patient received a total of 12cc.  Pt. Has deep and superficial reflux but small diams. Treated a combo of retics and spiders (ankles). Warned her I am working against high pressure and this may not work. She is also planning an active weekend (skiing) which won't help. She understands. Easy access. Tol well. Follow prn.                  Photos: Yes.    Compression stockings applied: Yes.

## 2018-02-26 DIAGNOSIS — M542 Cervicalgia: Secondary | ICD-10-CM | POA: Diagnosis not present

## 2018-03-10 DIAGNOSIS — M542 Cervicalgia: Secondary | ICD-10-CM | POA: Diagnosis not present

## 2018-04-13 DIAGNOSIS — M542 Cervicalgia: Secondary | ICD-10-CM | POA: Diagnosis not present

## 2018-04-27 DIAGNOSIS — M542 Cervicalgia: Secondary | ICD-10-CM | POA: Diagnosis not present

## 2018-05-04 DIAGNOSIS — M542 Cervicalgia: Secondary | ICD-10-CM | POA: Diagnosis not present

## 2018-05-11 DIAGNOSIS — M542 Cervicalgia: Secondary | ICD-10-CM | POA: Diagnosis not present

## 2018-07-21 DIAGNOSIS — M542 Cervicalgia: Secondary | ICD-10-CM | POA: Diagnosis not present

## 2018-08-03 DIAGNOSIS — M542 Cervicalgia: Secondary | ICD-10-CM | POA: Diagnosis not present

## 2018-08-10 DIAGNOSIS — M542 Cervicalgia: Secondary | ICD-10-CM | POA: Diagnosis not present

## 2018-08-13 ENCOUNTER — Other Ambulatory Visit: Payer: Self-pay | Admitting: Family Medicine

## 2018-08-13 DIAGNOSIS — J302 Other seasonal allergic rhinitis: Secondary | ICD-10-CM

## 2018-08-25 DIAGNOSIS — Z1231 Encounter for screening mammogram for malignant neoplasm of breast: Secondary | ICD-10-CM | POA: Diagnosis not present

## 2018-08-25 DIAGNOSIS — Z01419 Encounter for gynecological examination (general) (routine) without abnormal findings: Secondary | ICD-10-CM | POA: Diagnosis not present

## 2018-08-25 DIAGNOSIS — Z6827 Body mass index (BMI) 27.0-27.9, adult: Secondary | ICD-10-CM | POA: Diagnosis not present

## 2018-08-31 ENCOUNTER — Other Ambulatory Visit: Payer: Self-pay

## 2018-08-31 ENCOUNTER — Encounter: Payer: Self-pay | Admitting: Family Medicine

## 2018-08-31 ENCOUNTER — Ambulatory Visit (INDEPENDENT_AMBULATORY_CARE_PROVIDER_SITE_OTHER): Payer: BC Managed Care – PPO | Admitting: Family Medicine

## 2018-08-31 VITALS — BP 130/88 | HR 59 | Temp 97.9°F | Resp 12 | Ht 67.0 in | Wt 174.8 lb

## 2018-08-31 DIAGNOSIS — Z130 Encounter for screening for diseases of the blood and blood-forming organs and certain disorders involving the immune mechanism: Secondary | ICD-10-CM | POA: Diagnosis not present

## 2018-08-31 DIAGNOSIS — Z13228 Encounter for screening for other metabolic disorders: Secondary | ICD-10-CM | POA: Diagnosis not present

## 2018-08-31 DIAGNOSIS — Z13220 Encounter for screening for lipoid disorders: Secondary | ICD-10-CM | POA: Diagnosis not present

## 2018-08-31 DIAGNOSIS — Z0001 Encounter for general adult medical examination with abnormal findings: Secondary | ICD-10-CM | POA: Diagnosis not present

## 2018-08-31 DIAGNOSIS — R220 Localized swelling, mass and lump, head: Secondary | ICD-10-CM

## 2018-08-31 DIAGNOSIS — R030 Elevated blood-pressure reading, without diagnosis of hypertension: Secondary | ICD-10-CM | POA: Diagnosis not present

## 2018-08-31 DIAGNOSIS — Z1329 Encounter for screening for other suspected endocrine disorder: Secondary | ICD-10-CM

## 2018-08-31 DIAGNOSIS — M25511 Pain in right shoulder: Secondary | ICD-10-CM | POA: Diagnosis not present

## 2018-08-31 DIAGNOSIS — Z0000 Encounter for general adult medical examination without abnormal findings: Secondary | ICD-10-CM

## 2018-08-31 LAB — CBC
HCT: 39.6 % (ref 36.0–46.0)
Hemoglobin: 13.1 g/dL (ref 12.0–15.0)
MCHC: 33 g/dL (ref 30.0–36.0)
MCV: 92.7 fl (ref 78.0–100.0)
Platelets: 189 10*3/uL (ref 150.0–400.0)
RBC: 4.27 Mil/uL (ref 3.87–5.11)
RDW: 13.3 % (ref 11.5–15.5)
WBC: 4.8 10*3/uL (ref 4.0–10.5)

## 2018-08-31 LAB — LIPID PANEL
Cholesterol: 160 mg/dL (ref 0–200)
HDL: 65.5 mg/dL (ref >39.00)
LDL Cholesterol: 82 mg/dL (ref 0–99)
NonHDL: 94.04
Total CHOL/HDL Ratio: 2
Triglycerides: 60 mg/dL (ref 0.0–149.0)
VLDL: 12 mg/dL (ref 0.0–40.0)

## 2018-08-31 LAB — BASIC METABOLIC PANEL
BUN: 10 mg/dL (ref 6–23)
CO2: 26 mEq/L (ref 19–32)
Calcium: 9.1 mg/dL (ref 8.4–10.5)
Chloride: 99 mEq/L (ref 96–112)
Creatinine, Ser: 0.72 mg/dL (ref 0.40–1.20)
GFR: 84.97 mL/min (ref >60.00)
Glucose, Bld: 73 mg/dL (ref 70–99)
Potassium: 4.2 mEq/L (ref 3.5–5.1)
Sodium: 134 mEq/L — ABNORMAL LOW (ref 135–145)

## 2018-08-31 NOTE — Patient Instructions (Addendum)
A few things to remember from today's visit:   Routine general medical examination at a health care facility  Mandibular mass - Plan: CBC  Screening for lipoid disorders - Plan: Lipid panel  Screening for endocrine, metabolic and immunity disorder - Plan: Basic metabolic panel  Right shoulder pain, unspecified chronicity  Elevated blood pressure readings  Please be sure medication list is accurate. If a new problem present, please set up appointment sooner than planned today.   Check blood pressure daily with a good technique and immunoblot readings in 2 to 3 weeks. If right shoulder continue hurting, we may need to consider orthopedic referral. Keep appointment with your orthodontist.      Today you have you routine preventive visit.  At least 150 minutes of moderate exercise per week, daily brisk walking for 15-30 min is a good exercise option. Healthy diet low in saturated (animal) fats and sweets and consisting of fresh fruits and vegetables, lean meats such as fish and white chicken and whole grains.  These are some of recommendations for screening depending of age and risk factors:   - Vaccines:  Tdap vaccine every 10 years.  Shingles vaccine recommended at age 41, could be given after 52 years of age but not sure about insurance coverage.   Pneumonia vaccines:  Prevnar 13 at 65 and Pneumovax at 30. Sometimes Pneumovax is giving earlier if history of smoking, lung disease,diabetes,kidney disease among some.    Screening for diabetes at age 25 and every 3 years.  Cervical cancer prevention:  Pap smear starts at 52 years of age and continues periodically until 52 years old in low risk women. Pap smear every 3 years between 108 and 70 years old. Pap smear every 3-5 years between women 27 and older if pap smear negative and HPV screening negative.   -Breast cancer: Mammogram: There is disagreement between experts about when to start screening in low risk  asymptomatic female but recent recommendations are to start screening at 97 and not later than 52 years old , every 1-2 years and after 52 yo q 2 years. Screening is recommended until 53 years old but some women can continue screening depending of healthy issues.   Colon cancer screening: starts at 52 years old until 52 years old.  Cholesterol disorder screening at age 71 and every 3 years.  Also recommended:  1. Dental visit- Brush and floss your teeth twice daily; visit your dentist twice a year. 2. Eye doctor- Get an eye exam at least every 2 years. 3. Helmet use- Always wear a helmet when riding a bicycle, motorcycle, rollerblading or skateboarding. 4. Safe sex- If you may be exposed to sexually transmitted infections, use a condom. 5. Seat belts- Seat belts can save your live; always wear one. 6. Smoke/Carbon Monoxide detectors- These detectors need to be installed on the appropriate level of your home. Replace batteries at least once a year. 7. Skin cancer- When out in the sun please cover up and use sunscreen 15 SPF or higher. 8. Violence- If anyone is threatening or hurting you, please tell your healthcare provider.  9. Drink alcohol in moderation- Limit alcohol intake to one drink or less per day. Never drink and drive.

## 2018-08-31 NOTE — Progress Notes (Signed)
HPI:   Karen Short is a 53 y.o. female, who is here today for her routine physical.  Last CPE: 07/25/2017. She follows with her gynecologist annually.  Regular exercise 3 or more time per week: She is walking and hiking a few times per week. Following a healthy diet: Yes. She lives with her wife and 70 years old daughter.  Chronic medical problems: Allergy rhinitis.  Pap smear was done last week.   Immunization History  Administered Date(s) Administered  . Influenza Inj Mdck Quad Pf 12/18/2016    Mammogram: At her gyn's office last week. Colonoscopy: 03/2017. DEXA: N/A  She has some concerns today.  Tender "lump" in left mandibular area,noted about 30 days after she had dental surgery. She was treated with abx and had plain imaging done.It improved but again tender upon palpation since last week.  Denies fever,chills,skin rash/erythema,sore throat,dysphagia,cough,stridor,wheezing,or cough.  She has not taken OTC medications.  She is also concerned about elevated BP for about 2 months. Gyn's office BP was elevated at 120/90. Her BP is usually 110's/70's.  No Hx of HTN. Negative for headache ,visual changes,CP,palpitations,dyspnea,or edema.  She has been taking Advil daily for 2 months because right shoulder pain. No Hx of trauma. Pain is intermittent. She has not identified exacerbating or alleviating factors. Last week she did not have pain and this week she does. No edema or erythema.   Review of Systems  Constitutional: Negative for appetite change, fatigue and fever.  HENT: Negative for dental problem, hearing loss, mouth sores, sore throat, trouble swallowing and voice change.   Eyes: Negative for redness and visual disturbance.  Respiratory: Negative for cough, shortness of breath and wheezing.   Cardiovascular: Negative for chest pain and leg swelling.  Gastrointestinal: Negative for abdominal pain, nausea and vomiting.       No changes  in bowel habits.  Endocrine: Negative for cold intolerance, heat intolerance, polydipsia, polyphagia and polyuria.  Genitourinary: Negative for decreased urine volume, dysuria, hematuria, vaginal bleeding and vaginal discharge.  Musculoskeletal: Positive for arthralgias. Negative for gait problem and joint swelling.  Skin: Negative for color change and rash.  Allergic/Immunologic: Positive for environmental allergies.  Neurological: Negative for syncope, weakness and headaches.  Hematological: Negative for adenopathy. Does not bruise/bleed easily.  Psychiatric/Behavioral: Negative for confusion and sleep disturbance. The patient is not nervous/anxious.   All other systems reviewed and are negative.   Current Outpatient Medications on File Prior to Visit  Medication Sig Dispense Refill  . fluticasone (FLONASE) 50 MCG/ACT nasal spray PLACE 1 SPRAY INTO BOTH NOSTRILS 2 (TWO) TIMES DAILY AS NEEDED. 48 mL 2  . Multiple Vitamin (DAILY VITAMIN) tablet Take 1 tablet by mouth.     No current facility-administered medications on file prior to visit.      Past Medical History:  Diagnosis Date  . Allergy   . Arthritis   . Chicken pox   . History of frequent urinary tract infections   . Osteopenia   . PONV (postoperative nausea and vomiting)     Past Surgical History:  Procedure Laterality Date  . BREAST BIOPSY Left 2008   U/S Core- Benign  . BREAST CYST ASPIRATION Left   . BREAST CYST ASPIRATION Right   . BREAST SURGERY    . COLONOSCOPY     30 + yrs in [place] Right 2014  . ROBOT ASSISTED MYOMECTOMY  2011  . TONSILLECTOMY      Allergies  Allergen Reactions  . Epinephrine     Lightheaded, things spin and "start to go dark", rapid HR per patient.  . Latex Rash    Family History  Problem Relation Age of Onset  . Arthritis Mother   . Hyperlipidemia Mother   . Heart disease Mother   . Stroke Mother   . Hypertension Mother   . Diabetes Mother   .  Hyperlipidemia Father   . Heart disease Father   . Stroke Father   . Hypertension Father   . Breast cancer Maternal Aunt   . Breast cancer Paternal Aunt   . Colon polyps Neg Hx   . Colon cancer Neg Hx   . Esophageal cancer Neg Hx   . Rectal cancer Neg Hx   . Stomach cancer Neg Hx     Social History   Socioeconomic History  . Marital status: Married    Spouse name: Not on file  . Number of children: Not on file  . Years of education: Not on file  . Highest education level: Not on file  Occupational History  . Not on file  Social Needs  . Financial resource strain: Not on file  . Food insecurity    Worry: Not on file    Inability: Not on file  . Transportation needs    Medical: Not on file    Non-medical: Not on file  Tobacco Use  . Smoking status: Former Games developermoker  . Smokeless tobacco: Never Used  Substance and Sexual Activity  . Alcohol use: Yes    Comment: social  . Drug use: No  . Sexual activity: Not Currently    Birth control/protection: None  Lifestyle  . Physical activity    Days per week: Not on file    Minutes per session: Not on file  . Stress: Not on file  Relationships  . Social Musicianconnections    Talks on phone: Not on file    Gets together: Not on file    Attends religious service: Not on file    Active member of club or organization: Not on file    Attends meetings of clubs or organizations: Not on file    Relationship status: Not on file  Other Topics Concern  . Not on file  Social History Narrative  . Not on file    Vitals:   08/31/18 1412  BP: 130/88  Pulse: (!) 59  Resp: 12  Temp: 97.9 F (36.6 C)  SpO2: 98%   Body mass index is 27.38 kg/m.  Wt Readings from Last 3 Encounters:  08/31/18 174 lb 12.8 oz (79.3 kg)  10/17/17 175 lb (79.4 kg)  07/25/17 172 lb 6.4 oz (78.2 kg)    Physical Exam  Nursing note and vitals reviewed. Constitutional: She is oriented to person, place, and time. She appears well-developed and well-nourished.  No distress.  HENT:  Head: Normocephalic and atraumatic.  Right Ear: Hearing, tympanic membrane, external ear and ear canal normal.  Left Ear: Hearing, tympanic membrane, external ear and ear canal normal.  Mouth/Throat: Uvula is midline, oropharynx is clear and moist and mucous membranes are normal.    Tenderness upon palpation of point of concern,mid low mandible.I did not noted significant difference when compared with right side. Upon palpation of oral mucosa/gum, no tenderness but noted a hard prominent area, asymmetry when compared with right side.  Eyes: Pupils are equal, round, and reactive to light. Conjunctivae and EOM are normal.  Neck: No tracheal deviation present. No thyromegaly  present.  Cardiovascular: Normal rate and regular rhythm.  No murmur heard. Pulses:      Dorsalis pedis pulses are 2+ on the right side and 2+ on the left side.  HR 60/min by my count.  Respiratory: Effort normal and breath sounds normal. No respiratory distress.  GI: Soft. She exhibits no mass. There is no hepatomegaly. There is no abdominal tenderness.  Genitourinary:    Genitourinary Comments: Deferred to gyn.   Musculoskeletal:        General: No edema.     Right shoulder: She exhibits normal range of motion, no tenderness, no bony tenderness and no swelling.     Comments: No major deformity or signs of synovitis appreciated.  Lymphadenopathy:    She has no cervical adenopathy.       Right: No supraclavicular adenopathy present.       Left: No supraclavicular adenopathy present.  Neurological: She is alert and oriented to person, place, and time. She has normal strength. No cranial nerve deficit. Coordination and gait normal.  Reflex Scores:      Bicep reflexes are 2+ on the right side and 2+ on the left side.      Patellar reflexes are 2+ on the right side and 2+ on the left side. Skin: Skin is warm. No rash noted. No erythema.  Psychiatric: She has a normal mood and affect. Cognition and  memory are normal.  Well groomed, good eye contact.    ASSESSMENT AND PLAN:  [facility]. [facility] was here today annual physical examination.  Orders Placed This Encounter  Procedures  . Basic metabolic panel  . CBC  . Lipid panel    Lab Results  Component Value Date   CHOL 160 08/31/2018   HDL 65.50 08/31/2018   LDLCALC 82 08/31/2018   TRIG 60.0 08/31/2018   CHOLHDL 2 08/31/2018   Lab Results  Component Value Date   CREATININE 0.72 08/31/2018   BUN 10 08/31/2018   NA 134 (L) 08/31/2018   K 4.2 08/31/2018   CL 99 08/31/2018   CO2 26 08/31/2018   Lab Results  Component Value Date   WBC 4.8 08/31/2018   HGB 13.1 08/31/2018   HCT 39.6 08/31/2018   MCV 92.7 08/31/2018   PLT 189.0 08/31/2018    Routine general medical examination at a health care facility We discussed the importance of regular physical activity and healthy diet for prevention of chronic illness and/or complications. Preventive guidelines reviewed. She will continue following with gyn for her female preventive care. Vaccination up to date. Ca++ and vit D to continue.  Next CPE in a year.  Mandibular mass Most likely benign. She has appt with dental surgeon next week.  -     CBC  Screening for lipoid disorders -     Lipid panel  Screening for endocrine, metabolic and immunity disorder -     Basic metabolic panel  Right shoulder pain, unspecified chronicity She is not interested in PT evaluation for now. Tylenol 500 mg 3-4 times per day and ROM exercises. Ortho referral may be necessary if problem is persistent.  Elevated blood pressure reading We discussed Dx criteria for HTN and current recommendations. Monitor BP daily and let me know about reading in 2-3 weeks. Goal BP <=130/80. Low salt diet also recommended. Instructed about warning signs.    Return in about 1 year (around 08/31/2019) for CPE.   Nikodem Leadbetter G. [place], MD  [facility]. [place] office.

## 2018-09-02 ENCOUNTER — Encounter: Payer: Self-pay | Admitting: Family Medicine

## 2018-09-14 DIAGNOSIS — M542 Cervicalgia: Secondary | ICD-10-CM | POA: Diagnosis not present

## 2018-09-24 DIAGNOSIS — Z03818 Encounter for observation for suspected exposure to other biological agents ruled out: Secondary | ICD-10-CM | POA: Diagnosis not present

## 2018-10-09 ENCOUNTER — Encounter: Payer: Self-pay | Admitting: Family Medicine

## 2018-11-19 DIAGNOSIS — M542 Cervicalgia: Secondary | ICD-10-CM | POA: Diagnosis not present

## 2018-12-29 DIAGNOSIS — F4323 Adjustment disorder with mixed anxiety and depressed mood: Secondary | ICD-10-CM | POA: Diagnosis not present

## 2018-12-30 DIAGNOSIS — M542 Cervicalgia: Secondary | ICD-10-CM | POA: Diagnosis not present

## 2018-12-30 DIAGNOSIS — M25551 Pain in right hip: Secondary | ICD-10-CM | POA: Diagnosis not present

## 2018-12-30 DIAGNOSIS — M25511 Pain in right shoulder: Secondary | ICD-10-CM | POA: Diagnosis not present

## 2019-01-05 DIAGNOSIS — D2261 Melanocytic nevi of right upper limb, including shoulder: Secondary | ICD-10-CM | POA: Diagnosis not present

## 2019-01-05 DIAGNOSIS — D2262 Melanocytic nevi of left upper limb, including shoulder: Secondary | ICD-10-CM | POA: Diagnosis not present

## 2019-01-05 DIAGNOSIS — D225 Melanocytic nevi of trunk: Secondary | ICD-10-CM | POA: Diagnosis not present

## 2019-01-05 DIAGNOSIS — L821 Other seborrheic keratosis: Secondary | ICD-10-CM | POA: Diagnosis not present

## 2019-01-11 DIAGNOSIS — F4323 Adjustment disorder with mixed anxiety and depressed mood: Secondary | ICD-10-CM | POA: Diagnosis not present

## 2019-01-12 DIAGNOSIS — M25551 Pain in right hip: Secondary | ICD-10-CM | POA: Diagnosis not present

## 2019-01-20 DIAGNOSIS — M25551 Pain in right hip: Secondary | ICD-10-CM | POA: Diagnosis not present

## 2019-01-20 DIAGNOSIS — M542 Cervicalgia: Secondary | ICD-10-CM | POA: Diagnosis not present

## 2019-01-29 DIAGNOSIS — M25551 Pain in right hip: Secondary | ICD-10-CM | POA: Diagnosis not present

## 2019-01-29 DIAGNOSIS — M542 Cervicalgia: Secondary | ICD-10-CM | POA: Diagnosis not present

## 2019-02-05 DIAGNOSIS — M25551 Pain in right hip: Secondary | ICD-10-CM | POA: Diagnosis not present

## 2019-02-05 DIAGNOSIS — M542 Cervicalgia: Secondary | ICD-10-CM | POA: Diagnosis not present

## 2019-02-16 DIAGNOSIS — F4323 Adjustment disorder with mixed anxiety and depressed mood: Secondary | ICD-10-CM | POA: Diagnosis not present

## 2019-02-23 DIAGNOSIS — I83813 Varicose veins of bilateral lower extremities with pain: Secondary | ICD-10-CM | POA: Diagnosis not present

## 2019-02-23 DIAGNOSIS — I8312 Varicose veins of left lower extremity with inflammation: Secondary | ICD-10-CM | POA: Diagnosis not present

## 2019-02-23 DIAGNOSIS — I8311 Varicose veins of right lower extremity with inflammation: Secondary | ICD-10-CM | POA: Diagnosis not present

## 2019-02-25 DIAGNOSIS — F4323 Adjustment disorder with mixed anxiety and depressed mood: Secondary | ICD-10-CM | POA: Diagnosis not present

## 2019-03-04 DIAGNOSIS — F4323 Adjustment disorder with mixed anxiety and depressed mood: Secondary | ICD-10-CM | POA: Diagnosis not present

## 2019-03-09 DIAGNOSIS — F4323 Adjustment disorder with mixed anxiety and depressed mood: Secondary | ICD-10-CM | POA: Diagnosis not present

## 2019-03-11 DIAGNOSIS — M542 Cervicalgia: Secondary | ICD-10-CM | POA: Diagnosis not present

## 2019-03-11 DIAGNOSIS — M25551 Pain in right hip: Secondary | ICD-10-CM | POA: Diagnosis not present

## 2019-03-22 DIAGNOSIS — F4323 Adjustment disorder with mixed anxiety and depressed mood: Secondary | ICD-10-CM | POA: Diagnosis not present

## 2019-03-30 DIAGNOSIS — M542 Cervicalgia: Secondary | ICD-10-CM | POA: Diagnosis not present

## 2019-03-30 DIAGNOSIS — M25551 Pain in right hip: Secondary | ICD-10-CM | POA: Diagnosis not present

## 2019-04-08 DIAGNOSIS — F4323 Adjustment disorder with mixed anxiety and depressed mood: Secondary | ICD-10-CM | POA: Diagnosis not present

## 2019-04-13 DIAGNOSIS — I83811 Varicose veins of right lower extremities with pain: Secondary | ICD-10-CM | POA: Diagnosis not present

## 2019-04-13 DIAGNOSIS — I8311 Varicose veins of right lower extremity with inflammation: Secondary | ICD-10-CM | POA: Diagnosis not present

## 2019-04-16 DIAGNOSIS — I83812 Varicose veins of left lower extremities with pain: Secondary | ICD-10-CM | POA: Diagnosis not present

## 2019-04-16 DIAGNOSIS — I8312 Varicose veins of left lower extremity with inflammation: Secondary | ICD-10-CM | POA: Diagnosis not present

## 2019-04-21 DIAGNOSIS — M542 Cervicalgia: Secondary | ICD-10-CM | POA: Diagnosis not present

## 2019-04-21 DIAGNOSIS — M25551 Pain in right hip: Secondary | ICD-10-CM | POA: Diagnosis not present

## 2019-04-21 DIAGNOSIS — I8311 Varicose veins of right lower extremity with inflammation: Secondary | ICD-10-CM | POA: Diagnosis not present

## 2019-04-22 DIAGNOSIS — F4323 Adjustment disorder with mixed anxiety and depressed mood: Secondary | ICD-10-CM | POA: Diagnosis not present

## 2019-04-28 DIAGNOSIS — I8312 Varicose veins of left lower extremity with inflammation: Secondary | ICD-10-CM | POA: Diagnosis not present

## 2019-05-03 DIAGNOSIS — F4323 Adjustment disorder with mixed anxiety and depressed mood: Secondary | ICD-10-CM | POA: Diagnosis not present

## 2019-05-11 DIAGNOSIS — M25551 Pain in right hip: Secondary | ICD-10-CM | POA: Diagnosis not present

## 2019-05-11 DIAGNOSIS — M542 Cervicalgia: Secondary | ICD-10-CM | POA: Diagnosis not present

## 2019-05-12 DIAGNOSIS — Z713 Dietary counseling and surveillance: Secondary | ICD-10-CM | POA: Diagnosis not present

## 2019-05-19 DIAGNOSIS — F4323 Adjustment disorder with mixed anxiety and depressed mood: Secondary | ICD-10-CM | POA: Diagnosis not present

## 2019-05-24 DIAGNOSIS — I8311 Varicose veins of right lower extremity with inflammation: Secondary | ICD-10-CM | POA: Diagnosis not present

## 2019-05-25 DIAGNOSIS — I8312 Varicose veins of left lower extremity with inflammation: Secondary | ICD-10-CM | POA: Diagnosis not present

## 2019-05-31 ENCOUNTER — Other Ambulatory Visit: Payer: Self-pay | Admitting: Family Medicine

## 2019-05-31 DIAGNOSIS — J302 Other seasonal allergic rhinitis: Secondary | ICD-10-CM

## 2019-06-01 DIAGNOSIS — F4323 Adjustment disorder with mixed anxiety and depressed mood: Secondary | ICD-10-CM | POA: Diagnosis not present

## 2019-06-03 DIAGNOSIS — M542 Cervicalgia: Secondary | ICD-10-CM | POA: Diagnosis not present

## 2019-06-03 DIAGNOSIS — M25551 Pain in right hip: Secondary | ICD-10-CM | POA: Diagnosis not present

## 2019-06-17 DIAGNOSIS — F4323 Adjustment disorder with mixed anxiety and depressed mood: Secondary | ICD-10-CM | POA: Diagnosis not present

## 2019-06-21 DIAGNOSIS — I8311 Varicose veins of right lower extremity with inflammation: Secondary | ICD-10-CM | POA: Diagnosis not present

## 2019-06-22 DIAGNOSIS — I8312 Varicose veins of left lower extremity with inflammation: Secondary | ICD-10-CM | POA: Diagnosis not present

## 2019-06-24 DIAGNOSIS — M25551 Pain in right hip: Secondary | ICD-10-CM | POA: Diagnosis not present

## 2019-06-24 DIAGNOSIS — M542 Cervicalgia: Secondary | ICD-10-CM | POA: Diagnosis not present

## 2019-07-14 DIAGNOSIS — F4323 Adjustment disorder with mixed anxiety and depressed mood: Secondary | ICD-10-CM | POA: Diagnosis not present

## 2019-07-15 DIAGNOSIS — M25551 Pain in right hip: Secondary | ICD-10-CM | POA: Diagnosis not present

## 2019-07-15 DIAGNOSIS — M542 Cervicalgia: Secondary | ICD-10-CM | POA: Diagnosis not present

## 2019-07-21 DIAGNOSIS — Z713 Dietary counseling and surveillance: Secondary | ICD-10-CM | POA: Diagnosis not present

## 2019-07-27 DIAGNOSIS — F4323 Adjustment disorder with mixed anxiety and depressed mood: Secondary | ICD-10-CM | POA: Diagnosis not present

## 2019-07-29 DIAGNOSIS — M542 Cervicalgia: Secondary | ICD-10-CM | POA: Diagnosis not present

## 2019-07-29 DIAGNOSIS — M25551 Pain in right hip: Secondary | ICD-10-CM | POA: Diagnosis not present

## 2019-08-10 DIAGNOSIS — F4323 Adjustment disorder with mixed anxiety and depressed mood: Secondary | ICD-10-CM | POA: Diagnosis not present

## 2019-08-12 DIAGNOSIS — M542 Cervicalgia: Secondary | ICD-10-CM | POA: Diagnosis not present

## 2019-08-12 DIAGNOSIS — M25551 Pain in right hip: Secondary | ICD-10-CM | POA: Diagnosis not present

## 2019-08-17 ENCOUNTER — Other Ambulatory Visit: Payer: Self-pay

## 2019-08-17 ENCOUNTER — Ambulatory Visit: Payer: Self-pay

## 2019-08-17 ENCOUNTER — Ambulatory Visit: Payer: BC Managed Care – PPO | Admitting: Family Medicine

## 2019-08-17 ENCOUNTER — Encounter: Payer: Self-pay | Admitting: Family Medicine

## 2019-08-17 DIAGNOSIS — M542 Cervicalgia: Secondary | ICD-10-CM | POA: Diagnosis not present

## 2019-08-17 DIAGNOSIS — M25512 Pain in left shoulder: Secondary | ICD-10-CM

## 2019-08-17 DIAGNOSIS — M25551 Pain in right hip: Secondary | ICD-10-CM | POA: Diagnosis not present
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
## 2019-08-17 NOTE — Progress Notes (Signed)
I saw and examined the patient with Dr. Robby Sermon and agree with assessment and plan as outlined.    Left rotator cuff and biceps weakness, but ultrasound did not show a significant rotator cuff tear.  Question cervical radiculopathy or nerve stretch injury.  Will continue with PT.  If still no improvement, then nerve studies plus/minus cervical MRI.

## 2019-08-19 DIAGNOSIS — M25551 Pain in right hip: Secondary | ICD-10-CM | POA: Diagnosis not present

## 2019-08-19 DIAGNOSIS — M542 Cervicalgia: Secondary | ICD-10-CM | POA: Diagnosis not present

## 2019-08-31 DIAGNOSIS — M542 Cervicalgia: Secondary | ICD-10-CM | POA: Diagnosis not present

## 2019-08-31 DIAGNOSIS — M25551 Pain in right hip: Secondary | ICD-10-CM | POA: Diagnosis not present

## 2019-09-01 DIAGNOSIS — F4323 Adjustment disorder with mixed anxiety and depressed mood: Secondary | ICD-10-CM | POA: Diagnosis not present

## 2019-09-02 DIAGNOSIS — M25551 Pain in right hip: Secondary | ICD-10-CM | POA: Diagnosis not present

## 2019-09-02 DIAGNOSIS — M542 Cervicalgia: Secondary | ICD-10-CM | POA: Diagnosis not present

## 2019-09-08 DIAGNOSIS — Z1231 Encounter for screening mammogram for malignant neoplasm of breast: Secondary | ICD-10-CM | POA: Diagnosis not present

## 2019-09-08 DIAGNOSIS — Z13820 Encounter for screening for osteoporosis: Secondary | ICD-10-CM | POA: Diagnosis not present

## 2019-09-09 DIAGNOSIS — M542 Cervicalgia: Secondary | ICD-10-CM | POA: Diagnosis not present

## 2019-09-09 DIAGNOSIS — M25551 Pain in right hip: Secondary | ICD-10-CM | POA: Diagnosis not present

## 2019-09-14 DIAGNOSIS — M542 Cervicalgia: Secondary | ICD-10-CM | POA: Diagnosis not present

## 2019-09-14 DIAGNOSIS — M25551 Pain in right hip: Secondary | ICD-10-CM | POA: Diagnosis not present

## 2019-09-15 DIAGNOSIS — F4323 Adjustment disorder with mixed anxiety and depressed mood: Secondary | ICD-10-CM | POA: Diagnosis not present

## 2019-09-16 DIAGNOSIS — M542 Cervicalgia: Secondary | ICD-10-CM | POA: Diagnosis not present

## 2019-09-16 DIAGNOSIS — M25551 Pain in right hip: Secondary | ICD-10-CM | POA: Diagnosis not present

## 2019-09-23 DIAGNOSIS — M542 Cervicalgia: Secondary | ICD-10-CM | POA: Diagnosis not present

## 2019-09-23 DIAGNOSIS — M25551 Pain in right hip: Secondary | ICD-10-CM | POA: Diagnosis not present

## 2019-09-30 DIAGNOSIS — M25551 Pain in right hip: Secondary | ICD-10-CM | POA: Diagnosis not present

## 2019-09-30 DIAGNOSIS — M542 Cervicalgia: Secondary | ICD-10-CM | POA: Diagnosis not present

## 2019-10-01 DIAGNOSIS — F4323 Adjustment disorder with mixed anxiety and depressed mood: Secondary | ICD-10-CM | POA: Diagnosis not present

## 2019-10-07 DIAGNOSIS — I8311 Varicose veins of right lower extremity with inflammation: Secondary | ICD-10-CM | POA: Diagnosis not present

## 2019-10-07 DIAGNOSIS — I8312 Varicose veins of left lower extremity with inflammation: Secondary | ICD-10-CM | POA: Diagnosis not present

## 2019-10-14 DIAGNOSIS — M25551 Pain in right hip: Secondary | ICD-10-CM | POA: Diagnosis not present

## 2019-10-14 DIAGNOSIS — M542 Cervicalgia: Secondary | ICD-10-CM | POA: Diagnosis not present

## 2019-10-19 DIAGNOSIS — M25551 Pain in right hip: Secondary | ICD-10-CM | POA: Diagnosis not present

## 2019-10-19 DIAGNOSIS — M542 Cervicalgia: Secondary | ICD-10-CM | POA: Diagnosis not present

## 2019-10-26 DIAGNOSIS — M542 Cervicalgia: Secondary | ICD-10-CM | POA: Diagnosis not present

## 2019-10-26 DIAGNOSIS — M25551 Pain in right hip: Secondary | ICD-10-CM | POA: Diagnosis not present

## 2019-10-27 DIAGNOSIS — F4323 Adjustment disorder with mixed anxiety and depressed mood: Secondary | ICD-10-CM | POA: Diagnosis not present

## 2019-11-02 DIAGNOSIS — M542 Cervicalgia: Secondary | ICD-10-CM | POA: Diagnosis not present

## 2019-11-02 DIAGNOSIS — M25551 Pain in right hip: Secondary | ICD-10-CM | POA: Diagnosis not present

## 2019-11-11 DIAGNOSIS — F4323 Adjustment disorder with mixed anxiety and depressed mood: Secondary | ICD-10-CM | POA: Diagnosis not present

## 2019-11-15 DIAGNOSIS — M542 Cervicalgia: Secondary | ICD-10-CM | POA: Diagnosis not present

## 2019-11-15 DIAGNOSIS — M25551 Pain in right hip: Secondary | ICD-10-CM | POA: Diagnosis not present

## 2019-11-25 DIAGNOSIS — F4323 Adjustment disorder with mixed anxiety and depressed mood: Secondary | ICD-10-CM | POA: Diagnosis not present

## 2019-12-07 DIAGNOSIS — M25551 Pain in right hip: Secondary | ICD-10-CM | POA: Diagnosis not present

## 2019-12-07 DIAGNOSIS — M542 Cervicalgia: Secondary | ICD-10-CM | POA: Diagnosis not present

## 2019-12-09 DIAGNOSIS — F4323 Adjustment disorder with mixed anxiety and depressed mood: Secondary | ICD-10-CM | POA: Diagnosis not present

## 2019-12-13 ENCOUNTER — Encounter: Payer: BC Managed Care – PPO | Admitting: Family Medicine

## 2019-12-16 DIAGNOSIS — M25551 Pain in right hip: Secondary | ICD-10-CM | POA: Diagnosis not present

## 2019-12-16 DIAGNOSIS — M542 Cervicalgia: Secondary | ICD-10-CM | POA: Diagnosis not present

## 2019-12-21 ENCOUNTER — Encounter: Payer: Self-pay | Admitting: Family Medicine

## 2019-12-21 ENCOUNTER — Ambulatory Visit (INDEPENDENT_AMBULATORY_CARE_PROVIDER_SITE_OTHER): Payer: BC Managed Care – PPO | Admitting: Family Medicine

## 2019-12-21 ENCOUNTER — Other Ambulatory Visit: Payer: Self-pay

## 2019-12-21 VITALS — BP 122/80 | HR 63 | Temp 97.6°F | Resp 12 | Ht 67.0 in | Wt 175.1 lb

## 2019-12-21 DIAGNOSIS — Z130 Encounter for screening for diseases of the blood and blood-forming organs and certain disorders involving the immune mechanism: Secondary | ICD-10-CM

## 2019-12-21 DIAGNOSIS — Z23 Encounter for immunization: Secondary | ICD-10-CM

## 2019-12-21 DIAGNOSIS — Z1329 Encounter for screening for other suspected endocrine disorder: Secondary | ICD-10-CM | POA: Diagnosis not present

## 2019-12-21 DIAGNOSIS — Z1159 Encounter for screening for other viral diseases: Secondary | ICD-10-CM

## 2019-12-21 DIAGNOSIS — Z0189 Encounter for other specified special examinations: Secondary | ICD-10-CM

## 2019-12-21 DIAGNOSIS — M85852 Other specified disorders of bone density and structure, left thigh: Secondary | ICD-10-CM

## 2019-12-21 DIAGNOSIS — Z13220 Encounter for screening for lipoid disorders: Secondary | ICD-10-CM | POA: Diagnosis not present

## 2019-12-21 DIAGNOSIS — Z0000 Encounter for general adult medical examination without abnormal findings: Secondary | ICD-10-CM | POA: Diagnosis not present

## 2019-12-21 DIAGNOSIS — Z13228 Encounter for screening for other metabolic disorders: Secondary | ICD-10-CM

## 2019-12-21 NOTE — Progress Notes (Signed)
HPI: Karen Short is a 54 y.o. female, who is here today for her routine physical.  Last CPE: 08/31/18  Regular exercise 3 or more time per week: 5000 steps per day and riding her bike a few times per week. Following a healthy diet: In general she does.  Sometimes with stress she eats more. She met with nutritionist to help with diet.  Currently she lives with her wife and daughter. She is the process of separation.  Chronic medical problems: Allergic rhinitis and osteopenia.  Pap smear :4-5 years ago. She has appt with het gyn next month.  Immunization History  Administered Date(s) Administered  . Influenza Inj Mdck Quad Pf 12/18/2016  . Influenza, Seasonal, Injecte, Preservative Fre 12/18/2016  . Influenza,inj,Quad PF,6+ Mos 01/10/2018  . Influenza,inj,quad, With Preservative 12/18/2016  . PFIZER SARS-COV-2 Vaccination 05/07/2019, 05/28/2019   Mammogram: 07/2019 at her gyn's office. Colonoscopy: 03/27/17. DEXA: At her gyn's office, osteopenia.  Hep C screening: Never.  She has no concerns today. She would like to have vit D and HgA1C check.  Review of Systems  Constitutional: Negative for appetite change, fatigue and fever.  HENT: Negative for dental problem, hearing loss, mouth sores, sore throat, trouble swallowing and voice change.   Eyes: Negative for redness and visual disturbance.  Respiratory: Negative for cough, shortness of breath and wheezing.   Cardiovascular: Negative for chest pain and leg swelling.  Gastrointestinal: Negative for abdominal pain, nausea and vomiting.       No changes in bowel habits.  Endocrine: Negative for cold intolerance, heat intolerance, polydipsia, polyphagia and polyuria.  Genitourinary: Negative for decreased urine volume, dysuria, hematuria, vaginal bleeding and vaginal discharge.  Musculoskeletal: Positive for arthralgias. Negative for gait problem and myalgias.  Skin: Negative for color change and rash.    Allergic/Immunologic: Positive for environmental allergies.  Neurological: Negative for syncope, weakness and headaches.  Hematological: Negative for adenopathy. Does not bruise/bleed easily.  Psychiatric/Behavioral: Negative for confusion and sleep disturbance. The patient is not nervous/anxious.   All other systems reviewed and are negative.   Current Outpatient Medications on File Prior to Visit  Medication Sig Dispense Refill  . fluticasone (FLONASE) 50 MCG/ACT nasal spray USE 1 SPRAY IN EACH NOSTRIL 2 TIMES DAILY AS NEEDED 48 mL 2  . Multiple Vitamin (DAILY VITAMIN) tablet Take 1 tablet by mouth.    . naproxen sodium (ALEVE) 220 MG tablet Take 220 mg by mouth daily as needed.     No current facility-administered medications on file prior to visit.   Past Medical History:  Diagnosis Date  . Allergy   . Arthritis   . Chicken pox   . History of frequent urinary tract infections   . Osteopenia   . PONV (postoperative nausea and vomiting)     Past Surgical History:  Procedure Laterality Date  . BREAST BIOPSY Left 2008   U/S Core- Benign  . BREAST CYST ASPIRATION Left   . BREAST CYST ASPIRATION Right   . BREAST SURGERY    . COLONOSCOPY     30 + yrs in [place] Right 2014  . ROBOT ASSISTED MYOMECTOMY  2011  . TONSILLECTOMY      Allergies  Allergen Reactions  . Epinephrine     Lightheaded, things spin and "start to go dark", rapid HR per patient.  . Latex Rash    Family History  Problem Relation Age of Onset  . Arthritis Mother   . Hyperlipidemia Mother   .  Heart disease Mother   . Stroke Mother   . Hypertension Mother   . Diabetes Mother   . Hyperlipidemia Father   . Heart disease Father   . Stroke Father   . Hypertension Father   . Breast cancer Maternal Aunt   . Breast cancer Paternal Aunt   . Colon polyps Neg Hx   . Colon cancer Neg Hx   . Esophageal cancer Neg Hx   . Rectal cancer Neg Hx   . Stomach cancer Neg Hx     Social History    Socioeconomic History  . Marital status: Married    Spouse name: Not on file  . Number of children: Not on file  . Years of education: Not on file  . Highest education level: Not on file  Occupational History  . Not on file  Tobacco Use  . Smoking status: Former Research scientist (life sciences)  . Smokeless tobacco: Never Used  Substance and Sexual Activity  . Alcohol use: Yes    Comment: social  . Drug use: No  . Sexual activity: Not Currently    Birth control/protection: None  Other Topics Concern  . Not on file  Social History Narrative  . Not on file   Social Determinants of Health   Financial Resource Strain:   . Difficulty of Paying Living Expenses: Not on file  Food Insecurity:   . Worried About Charity fundraiser in the Last Year: Not on file  . Ran Out of Food in the Last Year: Not on file  Transportation Needs:   . Lack of Transportation (Medical): Not on file  . Lack of Transportation (Non-Medical): Not on file  Physical Activity:   . Days of Exercise per Week: Not on file  . Minutes of Exercise per Session: Not on file  Stress:   . Feeling of Stress : Not on file  Social Connections:   . Frequency of Communication with Friends and Family: Not on file  . Frequency of Social Gatherings with Friends and Family: Not on file  . Attends Religious Services: Not on file  . Active Member of Clubs or Organizations: Not on file  . Attends Archivist Meetings: Not on file  . Marital Status: Not on file   Vitals:   12/21/19 0802  BP: 122/80  Pulse: 63  Resp: 12  Temp: 97.6 F (36.4 C)  SpO2: 100%   Body mass index is 27.43 kg/m.   Wt Readings from Last 3 Encounters:  12/21/19 175 lb 2 oz (79.4 kg)  08/31/18 174 lb 12.8 oz (79.3 kg)  10/17/17 175 lb (79.4 kg)   Physical Exam Vitals and nursing note reviewed.  Constitutional:      General: She is not in acute distress.    Appearance: She is well-developed.  HENT:     Head: Normocephalic and atraumatic.      Right Ear: Hearing, tympanic membrane, ear canal and external ear normal.     Left Ear: Hearing, tympanic membrane, ear canal and external ear normal.     Mouth/Throat:     Mouth: Mucous membranes are moist.     Pharynx: Oropharynx is clear. Uvula midline.  Eyes:     Extraocular Movements: Extraocular movements intact.     Conjunctiva/sclera: Conjunctivae normal.     Pupils: Pupils are equal, round, and reactive to light.  Neck:     Thyroid: No thyromegaly.     Trachea: No tracheal deviation.  Cardiovascular:     Rate and  Rhythm: Normal rate and regular rhythm.     Pulses:          Dorsalis pedis pulses are 2+ on the right side and 2+ on the left side.     Heart sounds: No murmur heard.   Pulmonary:     Effort: Pulmonary effort is normal. No respiratory distress.     Breath sounds: Normal breath sounds.  Abdominal:     Palpations: Abdomen is soft. There is no hepatomegaly or mass.     Tenderness: There is no abdominal tenderness.  Genitourinary:    Comments: Deferred to gyn. Musculoskeletal:     Comments: No signs of synovitis appreciated. Heberden's node (DIP) left 5th finger.  Lymphadenopathy:     Cervical: No cervical adenopathy.     Upper Body:     Right upper body: No supraclavicular adenopathy.     Left upper body: No supraclavicular adenopathy.  Skin:    General: Skin is warm.     Findings: No erythema or rash.  Neurological:     Mental Status: She is alert and oriented to person, place, and time.     Cranial Nerves: No cranial nerve deficit.     Coordination: Coordination normal.     Gait: Gait normal.     Deep Tendon Reflexes:     Reflex Scores:      Bicep reflexes are 2+ on the right side and 2+ on the left side.      Patellar reflexes are 2+ on the right side and 2+ on the left side. Psychiatric:        Mood and Affect: Mood and affect normal.     Comments: Well groomed, good eye contact.   ASSESSMENT AND PLAN:  Karen Short was here today  annual physical examination.  Orders Placed This Encounter  Procedures  . Flu Vaccine QUAD 36+ mos IM  . BASIC METABOLIC PANEL WITH GFR  . Hemoglobin A1c  . Lipid panel  . Hepatitis C antibody  . VITAMIN D 25 Hydroxy (Vit-D Deficiency, Fractures)    Routine general medical examination at a health care facility We discussed the importance of regular physical activity and healthy diet for prevention of chronic illness and/or complications. Preventive guidelines reviewed. Vaccination up to date. Female preventive care to continue with her gyn. Ca++ and vit D supplementation recommended. Next CPE in a year.  The 10-year ASCVD risk score Mikey Bussing DC Brooke Bonito., et al., 2013) is: 1%   Values used to calculate the score:     Age: 64 years     Sex: Female     Is Non-Hispanic African American: No     Diabetic: No     Tobacco smoker: No     Systolic Blood Pressure: 741 mmHg     Is BP treated: No     HDL Cholesterol: 65.5 mg/dL     Total Cholesterol: 160 mg/dL  Need for influenza vaccination -     Flu Vaccine QUAD 36+ mos IM  Screening for lipoid disorders -     Lipid panel; Future  Screening for endocrine, metabolic and immunity disorder -     BASIC METABOLIC PANEL WITH GFR; Future -     Hemoglobin A1c; Future  Encounter for HCV screening test for low risk patient -     Hepatitis C antibody; Future  Osteopenia of left hip Fall prevention. Ca++ and vit D supplementation.   She is not fasting today,so will come back for fasting labs.  Return in 1 year (on 12/20/2020) for CPE. Fasting labs in a few day..  Latoi Giraldo G. [place], MD  [facility]. [place] office.   Today you have you routine preventive visit. A few things to remember from today's visit:   Routine general medical examination at a health care facility  Need for influenza vaccination - Plan: Flu Vaccine QUAD 36+ mos IM  Screening for lipoid disorders - Plan: Lipid panel  Screening for endocrine,  metabolic and immunity disorder - Plan: BASIC METABOLIC PANEL WITH GFR, Hemoglobin A1c  Encounter for HCV screening test for low risk patient - Plan: Hepatitis C antibody  Osteopenia of left hip - Plan: VITAMIN D 25 Hydroxy (Vit-D Deficiency, Fractures)  At least 150 minutes of moderate exercise per week, daily brisk walking for 15-30 min is a good exercise option. Healthy diet low in saturated (animal) fats and sweets and consisting of fresh fruits and vegetables, lean meats such as fish and white chicken and whole grains.  These are some of recommendations for screening depending of age and risk factors:  - Vaccines:  Tdap vaccine every 10 years.  Shingles vaccine recommended at age 77, could be given after 53 years of age but not sure about insurance coverage.   Pneumonia vaccines: Pneumovax at 30. Sometimes Pneumovax is giving earlier if history of smoking, lung disease,diabetes,kidney disease among some.  Screening for diabetes at age 60 and every 3 years.  Cervical cancer prevention:  Pap smear starts at 53 years of age and continues periodically until 53 years old in low risk women. Pap smear every 3 years between 3 and 46 years old. Pap smear every 3-5 years between women 41 and older if pap smear negative and HPV screening negative.   -Breast cancer: Mammogram: There is disagreement between experts about when to start screening in low risk asymptomatic female but recent recommendations are to start screening at 32 and not later than 53 years old , every 1-2 years and after 53 yo q 2 years. Screening is recommended until 53 years old but some women can continue screening depending of healthy issues.  Colon cancer screening: Has been recently changed to 53 yo. Insurance may not cover until you are 53 years old. Screening is recommended until 53 years old.  Cholesterol disorder screening at age 75 and every 3 years.  Also recommended:  1. Dental visit- Brush and floss your  teeth twice daily; visit your dentist twice a year. 2. Eye doctor- Get an eye exam at least every 2 years. 3. Helmet use- Always wear a helmet when riding a bicycle, motorcycle, rollerblading or skateboarding. 4. Safe sex- If you may be exposed to sexually transmitted infections, use a condom. 5. Seat belts- Seat belts can save your live; always wear one. 6. Smoke/Carbon Monoxide detectors- These detectors need to be installed on the appropriate level of your home. Replace batteries at least once a year. 7. Skin cancer- When out in the sun please cover up and use sunscreen 15 SPF or higher. 8. Violence- If anyone is threatening or hurting you, please tell your healthcare provider.  9. Drink alcohol in moderation- Limit alcohol intake to one drink or less per day. Never drink and drive. 10. Calcium supplementation 1000 to 1200 mg daily, ideally through your diet.  Vitamin D supplementation 800 units daily.

## 2019-12-21 NOTE — Patient Instructions (Signed)
Today you have you routine preventive visit. A few things to remember from today's visit:   Routine general medical examination at a health care facility  Need for influenza vaccination - Plan: Flu Vaccine QUAD 36+ mos IM  Screening for lipoid disorders - Plan: Lipid panel  Screening for endocrine, metabolic and immunity disorder - Plan: BASIC METABOLIC PANEL WITH GFR, Hemoglobin A1c  Encounter for HCV screening test for low risk patient - Plan: Hepatitis C antibody  Osteopenia of left hip - Plan: VITAMIN D 25 Hydroxy (Vit-D Deficiency, Fractures)  At least 150 minutes of moderate exercise per week, daily brisk walking for 15-30 min is a good exercise option. Healthy diet low in saturated (animal) fats and sweets and consisting of fresh fruits and vegetables, lean meats such as fish and white chicken and whole grains.  These are some of recommendations for screening depending of age and risk factors:  - Vaccines:  Tdap vaccine every 10 years.  Shingles vaccine recommended at age 53, could be given after 53 years of age but not sure about insurance coverage.   Pneumonia vaccines: Pneumovax at 53. Sometimes Pneumovax is giving earlier if history of smoking, lung disease,diabetes,kidney disease among some.  Screening for diabetes at age 53 and every 3 years.  Cervical cancer prevention:  Pap smear starts at 53 years of age and continues periodically until 53 years old in low risk women. Pap smear every 3 years between 53 and 53 years old. Pap smear every 3-5 years between women 53 and older if pap smear negative and HPV screening negative.   -Breast cancer: Mammogram: There is disagreement between experts about when to start screening in low risk asymptomatic female but recent recommendations are to start screening at 53 and not later than 53 years old , every 1-2 years and after 53 yo q 2 years. Screening is recommended until 53 years old but some women can continue screening  depending of healthy issues.  Colon cancer screening: Has been recently changed to 54 yo. Insurance may not cover until you are 53 years old. Screening is recommended until 53 years old.  Cholesterol disorder screening at age 53 and every 3 years.  Also recommended:  1. Dental visit- Brush and floss your teeth twice daily; visit your dentist twice a year. 2. Eye doctor- Get an eye exam at least every 2 years. 3. Helmet use- Always wear a helmet when riding a bicycle, motorcycle, rollerblading or skateboarding. 4. Safe sex- If you may be exposed to sexually transmitted infections, use a condom. 5. Seat belts- Seat belts can save your live; always wear one. 6. Smoke/Carbon Monoxide detectors- These detectors need to be installed on the appropriate level of your home. Replace batteries at least once a year. 7. Skin cancer- When out in the sun please cover up and use sunscreen 15 SPF or higher. 8. Violence- If anyone is threatening or hurting you, please tell your healthcare provider.  9. Drink alcohol in moderation- Limit alcohol intake to one drink or less per day. Never drink and drive. 10. Calcium supplementation 1000 to 1200 mg daily, ideally through your diet.  Vitamin D supplementation 800 units daily.

## 2019-12-30 DIAGNOSIS — M542 Cervicalgia: Secondary | ICD-10-CM | POA: Diagnosis not present

## 2019-12-30 DIAGNOSIS — M25551 Pain in right hip: Secondary | ICD-10-CM | POA: Diagnosis not present

## 2019-12-31 ENCOUNTER — Other Ambulatory Visit: Payer: Self-pay

## 2019-12-31 ENCOUNTER — Other Ambulatory Visit (INDEPENDENT_AMBULATORY_CARE_PROVIDER_SITE_OTHER): Payer: BC Managed Care – PPO

## 2019-12-31 DIAGNOSIS — Z0189 Encounter for other specified special examinations: Secondary | ICD-10-CM

## 2019-12-31 DIAGNOSIS — Z1159 Encounter for screening for other viral diseases: Secondary | ICD-10-CM

## 2019-12-31 DIAGNOSIS — Z13228 Encounter for screening for other metabolic disorders: Secondary | ICD-10-CM

## 2019-12-31 DIAGNOSIS — Z13220 Encounter for screening for lipoid disorders: Secondary | ICD-10-CM

## 2019-12-31 DIAGNOSIS — M85852 Other specified disorders of bone density and structure, left thigh: Secondary | ICD-10-CM

## 2019-12-31 LAB — LIPID PANEL
Cholesterol: 149 mg/dL (ref 0–200)
HDL: 65.1 mg/dL (ref >39.00)
LDL Cholesterol: 70 mg/dL (ref 0–99)
NonHDL: 83.62
Total CHOL/HDL Ratio: 2
Triglycerides: 66 mg/dL (ref 0.0–149.0)
VLDL: 13.2 mg/dL (ref 0.0–40.0)

## 2019-12-31 LAB — BASIC METABOLIC PANEL
BUN: 14 mg/dL (ref 6–23)
CO2: 26 mEq/L (ref 19–32)
Calcium: 8.7 mg/dL (ref 8.4–10.5)
Chloride: 103 mEq/L (ref 96–112)
Creatinine, Ser: 0.79 mg/dL (ref 0.40–1.20)
GFR: 85.29 mL/min (ref >60.00)
Glucose, Bld: 87 mg/dL (ref 70–99)
Potassium: 4 mEq/L (ref 3.5–5.1)
Sodium: 134 mEq/L — ABNORMAL LOW (ref 135–145)

## 2019-12-31 LAB — VITAMIN D 25 HYDROXY (VIT D DEFICIENCY, FRACTURES): VITD: 19.71 ng/mL — ABNORMAL LOW (ref 30.00–100.00)

## 2019-12-31 LAB — HEMOGLOBIN A1C: Hgb A1c MFr Bld: 5.4 % (ref 4.6–6.5)

## 2019-12-31 NOTE — Addendum Note (Signed)
Addended by: Lerry Liner on: 12/31/2019 07:54 AM   Modules accepted: Orders

## 2020-01-03 DIAGNOSIS — F4323 Adjustment disorder with mixed anxiety and depressed mood: Secondary | ICD-10-CM | POA: Diagnosis not present

## 2020-01-03 LAB — HEPATITIS C ANTIBODY
Hepatitis C Ab: NONREACTIVE
SIGNAL TO CUT-OFF: 0.02 (ref <1.00)

## 2020-01-05 DIAGNOSIS — D692 Other nonthrombocytopenic purpura: Secondary | ICD-10-CM | POA: Diagnosis not present

## 2020-01-05 DIAGNOSIS — D2261 Melanocytic nevi of right upper limb, including shoulder: Secondary | ICD-10-CM | POA: Diagnosis not present

## 2020-01-05 DIAGNOSIS — D2262 Melanocytic nevi of left upper limb, including shoulder: Secondary | ICD-10-CM | POA: Diagnosis not present

## 2020-01-05 DIAGNOSIS — L821 Other seborrheic keratosis: Secondary | ICD-10-CM | POA: Diagnosis not present

## 2020-01-20 DIAGNOSIS — F4323 Adjustment disorder with mixed anxiety and depressed mood: Secondary | ICD-10-CM | POA: Diagnosis not present

## 2020-02-08 DIAGNOSIS — F4323 Adjustment disorder with mixed anxiety and depressed mood: Secondary | ICD-10-CM | POA: Diagnosis not present

## 2020-02-22 DIAGNOSIS — Z6828 Body mass index (BMI) 28.0-28.9, adult: Secondary | ICD-10-CM | POA: Diagnosis not present

## 2020-02-22 DIAGNOSIS — Z01419 Encounter for gynecological examination (general) (routine) without abnormal findings: Secondary | ICD-10-CM | POA: Diagnosis not present

## 2020-02-22 DIAGNOSIS — M25551 Pain in right hip: Secondary | ICD-10-CM | POA: Diagnosis not present

## 2020-02-22 DIAGNOSIS — M542 Cervicalgia: Secondary | ICD-10-CM | POA: Diagnosis not present

## 2020-02-24 ENCOUNTER — Other Ambulatory Visit: Payer: Self-pay | Admitting: Obstetrics and Gynecology

## 2020-02-24 DIAGNOSIS — Z9189 Other specified personal risk factors, not elsewhere classified: Secondary | ICD-10-CM

## 2020-02-28 DIAGNOSIS — F4323 Adjustment disorder with mixed anxiety and depressed mood: Secondary | ICD-10-CM | POA: Diagnosis not present

## 2020-03-05 ENCOUNTER — Other Ambulatory Visit: Payer: Self-pay | Admitting: Family Medicine

## 2020-03-05 DIAGNOSIS — J302 Other seasonal allergic rhinitis: Secondary | ICD-10-CM

## 2020-03-07 DIAGNOSIS — M25551 Pain in right hip: Secondary | ICD-10-CM | POA: Diagnosis not present

## 2020-03-07 DIAGNOSIS — M542 Cervicalgia: Secondary | ICD-10-CM | POA: Diagnosis not present

## 2020-03-08 DIAGNOSIS — F4323 Adjustment disorder with mixed anxiety and depressed mood: Secondary | ICD-10-CM | POA: Diagnosis not present

## 2020-03-16 DIAGNOSIS — F4323 Adjustment disorder with mixed anxiety and depressed mood: Secondary | ICD-10-CM | POA: Diagnosis not present

## 2020-03-17 DIAGNOSIS — M542 Cervicalgia: Secondary | ICD-10-CM | POA: Diagnosis not present

## 2020-03-17 DIAGNOSIS — M25551 Pain in right hip: Secondary | ICD-10-CM | POA: Diagnosis not present

## 2020-03-27 DIAGNOSIS — F4323 Adjustment disorder with mixed anxiety and depressed mood: Secondary | ICD-10-CM | POA: Diagnosis not present

## 2020-03-31 DIAGNOSIS — M542 Cervicalgia: Secondary | ICD-10-CM | POA: Diagnosis not present

## 2020-03-31 DIAGNOSIS — M25551 Pain in right hip: Secondary | ICD-10-CM | POA: Diagnosis not present

## 2020-04-03 ENCOUNTER — Ambulatory Visit
Admission: RE | Admit: 2020-04-03 | Discharge: 2020-04-03 | Disposition: A | Discharge status: Home / Self Care | Payer: BC Managed Care – PPO | Source: Ambulatory Visit | Attending: Obstetrics and Gynecology | Admitting: Obstetrics and Gynecology

## 2020-04-03 DIAGNOSIS — Z9189 Other specified personal risk factors, not elsewhere classified: Secondary | ICD-10-CM

## 2020-04-03 DIAGNOSIS — N6323 Unspecified lump in the left breast, lower outer quadrant: Secondary | ICD-10-CM | POA: Diagnosis not present

## 2020-04-03 MED ORDER — GADOBUTROL 1 MMOL/ML IV SOLN
8.0000 mL | Freq: Once | INTRAVENOUS | Status: AC | PRN
  Administered 2020-04-03: 8 mL via INTRAVENOUS

## 2020-04-06 ENCOUNTER — Other Ambulatory Visit: Payer: Self-pay | Admitting: Obstetrics and Gynecology

## 2020-04-06 DIAGNOSIS — R9389 Abnormal findings on diagnostic imaging of other specified body structures: Secondary | ICD-10-CM

## 2020-04-13 DIAGNOSIS — M25551 Pain in right hip: Secondary | ICD-10-CM | POA: Diagnosis not present

## 2020-04-13 DIAGNOSIS — M542 Cervicalgia: Secondary | ICD-10-CM | POA: Diagnosis not present

## 2020-04-17 ENCOUNTER — Ambulatory Visit
Admission: RE | Admit: 2020-04-17 | Discharge: 2020-04-17 | Disposition: A | Discharge status: Home / Self Care | Payer: BC Managed Care – PPO | Source: Ambulatory Visit | Attending: Obstetrics and Gynecology | Admitting: Obstetrics and Gynecology

## 2020-04-17 ENCOUNTER — Other Ambulatory Visit: Payer: Self-pay

## 2020-04-17 DIAGNOSIS — R9389 Abnormal findings on diagnostic imaging of other specified body structures: Secondary | ICD-10-CM

## 2020-04-17 DIAGNOSIS — N6489 Other specified disorders of breast: Secondary | ICD-10-CM | POA: Diagnosis not present

## 2020-04-18 ENCOUNTER — Other Ambulatory Visit: Payer: Self-pay | Admitting: Obstetrics and Gynecology

## 2020-04-18 DIAGNOSIS — F4323 Adjustment disorder with mixed anxiety and depressed mood: Secondary | ICD-10-CM | POA: Diagnosis not present

## 2020-04-18 DIAGNOSIS — R9389 Abnormal findings on diagnostic imaging of other specified body structures: Secondary | ICD-10-CM

## 2020-04-27 DIAGNOSIS — M542 Cervicalgia: Secondary | ICD-10-CM | POA: Diagnosis not present

## 2020-04-27 DIAGNOSIS — M25551 Pain in right hip: Secondary | ICD-10-CM | POA: Diagnosis not present

## 2020-05-08 DIAGNOSIS — F4323 Adjustment disorder with mixed anxiety and depressed mood: Secondary | ICD-10-CM | POA: Diagnosis not present

## 2020-05-12 ENCOUNTER — Other Ambulatory Visit (HOSPITAL_COMMUNITY): Payer: Self-pay | Admitting: Diagnostic Radiology

## 2020-05-12 ENCOUNTER — Other Ambulatory Visit: Payer: Self-pay | Admitting: Diagnostic Radiology

## 2020-05-12 ENCOUNTER — Other Ambulatory Visit: Payer: Self-pay

## 2020-05-12 ENCOUNTER — Ambulatory Visit
Admission: RE | Admit: 2020-05-12 | Discharge: 2020-05-12 | Disposition: A | Discharge status: Home / Self Care | Payer: BC Managed Care – PPO | Source: Ambulatory Visit | Attending: Obstetrics and Gynecology | Admitting: Obstetrics and Gynecology

## 2020-05-12 DIAGNOSIS — D242 Benign neoplasm of left breast: Secondary | ICD-10-CM | POA: Diagnosis not present

## 2020-05-12 DIAGNOSIS — R928 Other abnormal and inconclusive findings on diagnostic imaging of breast: Secondary | ICD-10-CM | POA: Diagnosis not present

## 2020-05-12 DIAGNOSIS — R9389 Abnormal findings on diagnostic imaging of other specified body structures: Secondary | ICD-10-CM

## 2020-05-12 MED ORDER — GADOBUTROL 1 MMOL/ML IV SOLN
7.0000 mL | Freq: Once | INTRAVENOUS | Status: AC | PRN
  Administered 2020-05-12: 7 mL via INTRAVENOUS

## 2020-05-16 DIAGNOSIS — M542 Cervicalgia: Secondary | ICD-10-CM | POA: Diagnosis not present

## 2020-05-16 DIAGNOSIS — M25551 Pain in right hip: Secondary | ICD-10-CM | POA: Diagnosis not present

## 2020-05-23 DIAGNOSIS — H5213 Myopia, bilateral: Secondary | ICD-10-CM | POA: Diagnosis not present

## 2020-05-23 DIAGNOSIS — H04123 Dry eye syndrome of bilateral lacrimal glands: Secondary | ICD-10-CM | POA: Diagnosis not present

## 2020-05-25 DIAGNOSIS — M25551 Pain in right hip: Secondary | ICD-10-CM | POA: Diagnosis not present

## 2020-05-25 DIAGNOSIS — M542 Cervicalgia: Secondary | ICD-10-CM | POA: Diagnosis not present

## 2020-06-16 DIAGNOSIS — M542 Cervicalgia: Secondary | ICD-10-CM | POA: Diagnosis not present

## 2020-06-16 DIAGNOSIS — M25551 Pain in right hip: Secondary | ICD-10-CM | POA: Diagnosis not present

## 2020-06-19 DIAGNOSIS — Z30430 Encounter for insertion of intrauterine contraceptive device: Secondary | ICD-10-CM | POA: Diagnosis not present

## 2020-06-20 DIAGNOSIS — Z01419 Encounter for gynecological examination (general) (routine) without abnormal findings: Secondary | ICD-10-CM | POA: Diagnosis not present

## 2020-06-20 LAB — HM PAP SMEAR: HPV, high-risk: NEGATIVE

## 2020-06-20 LAB — RESULTS CONSOLE HPV: CHL HPV: NEGATIVE

## 2020-06-30 DIAGNOSIS — M542 Cervicalgia: Secondary | ICD-10-CM | POA: Diagnosis not present

## 2020-06-30 DIAGNOSIS — M25551 Pain in right hip: Secondary | ICD-10-CM | POA: Diagnosis not present

## 2020-07-06 DIAGNOSIS — F411 Generalized anxiety disorder: Secondary | ICD-10-CM | POA: Diagnosis not present

## 2020-07-18 DIAGNOSIS — F4323 Adjustment disorder with mixed anxiety and depressed mood: Secondary | ICD-10-CM | POA: Diagnosis not present

## 2020-07-18 DIAGNOSIS — M25551 Pain in right hip: Secondary | ICD-10-CM | POA: Diagnosis not present

## 2020-07-18 DIAGNOSIS — M542 Cervicalgia: Secondary | ICD-10-CM | POA: Diagnosis not present

## 2020-07-20 DIAGNOSIS — D509 Iron deficiency anemia, unspecified: Secondary | ICD-10-CM | POA: Diagnosis not present

## 2020-07-26 DIAGNOSIS — F4323 Adjustment disorder with mixed anxiety and depressed mood: Secondary | ICD-10-CM | POA: Diagnosis not present

## 2020-08-01 DIAGNOSIS — N939 Abnormal uterine and vaginal bleeding, unspecified: Secondary | ICD-10-CM | POA: Diagnosis not present

## 2020-08-01 DIAGNOSIS — D259 Leiomyoma of uterus, unspecified: Secondary | ICD-10-CM | POA: Diagnosis not present

## 2020-08-04 DIAGNOSIS — Z20822 Contact with and (suspected) exposure to covid-19: Secondary | ICD-10-CM | POA: Diagnosis not present

## 2020-08-04 DIAGNOSIS — U071 COVID-19: Secondary | ICD-10-CM | POA: Diagnosis not present

## 2020-08-10 DIAGNOSIS — F4323 Adjustment disorder with mixed anxiety and depressed mood: Secondary | ICD-10-CM | POA: Diagnosis not present

## 2020-08-18 DIAGNOSIS — M542 Cervicalgia: Secondary | ICD-10-CM | POA: Diagnosis not present

## 2020-08-18 DIAGNOSIS — M25551 Pain in right hip: Secondary | ICD-10-CM | POA: Diagnosis not present

## 2020-08-22 ENCOUNTER — Telehealth: Payer: BC Managed Care – PPO | Admitting: Family Medicine

## 2020-08-29 DIAGNOSIS — M542 Cervicalgia: Secondary | ICD-10-CM | POA: Diagnosis not present

## 2020-08-29 DIAGNOSIS — M25551 Pain in right hip: Secondary | ICD-10-CM | POA: Diagnosis not present

## 2020-09-01 ENCOUNTER — Encounter: Payer: Self-pay | Admitting: Family Medicine

## 2020-09-01 ENCOUNTER — Ambulatory Visit: Payer: BC Managed Care – PPO | Admitting: Family Medicine

## 2020-09-01 ENCOUNTER — Other Ambulatory Visit: Payer: Self-pay

## 2020-09-01 VITALS — BP 126/70 | HR 83 | Resp 16 | Ht 67.0 in | Wt 177.1 lb

## 2020-09-01 DIAGNOSIS — J989 Respiratory disorder, unspecified: Secondary | ICD-10-CM | POA: Diagnosis not present

## 2020-09-01 DIAGNOSIS — Z7184 Encounter for health counseling related to travel: Secondary | ICD-10-CM

## 2020-09-01 DIAGNOSIS — J302 Other seasonal allergic rhinitis: Secondary | ICD-10-CM | POA: Diagnosis not present

## 2020-09-01 DIAGNOSIS — R059 Cough, unspecified: Secondary | ICD-10-CM | POA: Diagnosis not present

## 2020-09-01 NOTE — Patient Instructions (Addendum)
A few things to remember from today's visit:   Cough  Seasonal allergic rhinitis, unspecified trigger  Reactive airway disease without asthma  Travel advice encounter  Albuterol inh 2 puff every 6 hours for a week then as needed for wheezing or shortness of breath.   If you need refills please call your pharmacy. Do not use My Chart to request refills or for acute issues that need immediate attention.   Some of the vaccinations needed to travel to [place]: Yellow fever, hepatitis A,typhoid, MMR, and Tdap. We can bring you to the clinic to update vaccine, you can also go to a traveler's clinic. Travel Vaccine Information Vaccines (immunizations) can protect you from certain diseases. If you plan to travel to another country, see your health care provider or a travel medicine specialist to discuss: Where you are going. Include all countries in your travel schedule. How long you are staying. What you will be doing. Based on this information, your health care provider may recommend: Routine vaccines. These vaccines are standard for all children and adults. Travel vaccines: For most travelers. These vaccines are recommended for most travelers before foreign travel. For some travelers. These vaccines may be necessary based on the destination country or region. It is important to see your health care provider at least 4-6 weeks before you travel. This allows time for recommended vaccines to take effect. It also provides enough time for you to get vaccines that must be given in a series over a period of days or weeks. If you have fewer than 4 weeks before you leave, you should still see your health care provider. You might still benefitfrom vaccines or medicines. What are routine vaccines? Routine vaccines are shots that can protect you from common diseases in many parts of the world. Most routine vaccines are given at certain ages starting in childhood. It is important that you are up to date on  your routine vaccines before you travel. Routine vaccines include: An annual flu (influenza) vaccine. The annual influenza vaccine sometimes differs for the [place] and [place] hemispheres. You should: Get both vaccines if you are traveling to the other hemisphere and you have a chronic medical condition. Get the vaccine shortly before or during the flu season, and only if the vaccine in your country differs from the vaccine in your destination country. Get the other influenza vaccine either before leaving the country or shortly after arriving at the destination country. Age-related vaccines. Infants 6-11 months old should receive a measles, mumps, and rubella (MMR) vaccine before travel to another country. Children and adults should be up to date with all recommended vaccines. Adults 71 years or older should talk to their health care provider about getting a vaccine against a certain type of pneumonia (pneumococcal) and a vaccine against shingles (herpes zoster). Extra doses of certain vaccines (booster vaccines), such as Tdap (tetanus, diphtheria, and pertussis). What are recommended vaccines? Recommended travel vaccines change over time. Your health care provider can tell you what vaccines are recommended before your trip. Recommended vaccines will depend on: The country or countries of travel. Whether you will be traveling to rural areas. How long you will be traveling. The season of the year. Your health status. Your vaccine history. For most travelers The following vaccines are recommended for most international travelers, depending on the country or countries you are traveling to: Hepatitis A. Typhoid. For some travelers Additional vaccines may be required when traveling to certain countries, due to a disease being common in  a particular area or an ongoing outbreak of a disease. The following vaccines may be recommended based on where you are traveling: Yellow fever vaccine. This is  required before traveling to certain countries in [place] and [place]. Get the yellow fever vaccine at an approved center at least 10 days before your trip. You will receive a stamp, certificate, or other proof of yellow fever immunization. If proof of immunization is incomplete or inaccurate, you could be medically isolated (quarantined), denied entry, or given another dose of vaccine at the travel site. If it has been longer than 10 years since you received the yellow fever vaccine, another dose is required. Meningococcal vaccine. This may be required prior to travel to parts of [place] and [place]. Get this vaccine at least 10 days before your trip. After 10 days, most people show immunity to meningococcal disease. Proof of meningococcal immunization is required by the [place] for any person taking part in a Muslim pilgrimage. You may not receive a visa if you are not able to provide proof of immunization. If it has been longer than 3 years since your last immunization, another dose may be required. Polio vaccine. If you travel to a country where there is a higher risk of getting polio, you may need a booster dose. Polio is a routine vaccine that most people receive as a child. Even if you completed the vaccine series as a child, you may need a booster dose before traveling to high-risk countries. Infants and children may need to follow an accelerated schedule to complete the polio vaccine series before traveling to high-risk countries. Some countries may require you to show proof that you have been vaccinated. Depending on your travel plans, you may need additional vaccines, such as: Hepatitis B. Rabies. Tick-borne encephalitis. Cholera. Where to find more information Centers for Disease Control and Prevention (CDC): http://www.wolf.info/ World Health Organization [facility]): RoleLink.com.br U.S. Department of Health and Human Services: www.vaccines.gov Summary Vaccines can  protect you from certain diseases, and they can also prevent the spread of certain infections. See your health care provider at least 4-6 weeks before you travel. This allows time for vaccines to take effect. Vaccines for travelers include routine vaccines, recommended travel vaccines, and geographically required travel vaccines. The most commonly recommended travel vaccines are the hepatitis A and typhoid vaccines. This information is not intended to replace advice given to you by your health care provider. Make sure you discuss any questions you have with your healthcare provider. Document Revised: 02/23/2020 Document Reviewed: 03/07/2017 Elsevier Patient Education  2022 [place].   Please be sure medication list is accurate. If a new problem present, please set up appointment sooner than planned today.

## 2020-09-01 NOTE — Progress Notes (Signed)
Chief Complaint  Patient presents with   lingering symptoms from covid   HPI: Karen Short is a 55 y.o. female, who is here today complaining of persistent cough, sometimes productive with clear sputum. Diagnosed with COVID-19 infection on 08/04/2020. Evaluated in urgent care. This week she is feeling better and closer to her baseline, some symptoms have resolved.  +Fatigue, nasal congestion, rhinorrhea, and postnasal drainage. Fatigue has improved. She has not noted significant wheezing this week, she did so a couple weeks ago. Last time she used Albuterol inh was 3 weeks ago. Headache have resolved. Negative for fever, chills, sore throat, anosmia,[place],SOB,heartburn, nausea, vomiting, or changes in bowel habits.  Allergic rhinitis: Currently she is on Flonase nasal spray daily as needed.  She is taking Mucinex,Albuterol in, and benzonatate.  She is planning on traveling to [place], [place], and [place] the third week of 10/2020, she wants to be sure he has all her required vaccinations up-to-date.  She is traveling for work, she never been in these places. She remember having vaccines years ago prior to adopting her daughter, not sure which ones.  Immunization History  Administered Date(s) Administered   Influenza Inj Mdck Quad Pf 12/18/2016   Influenza, Seasonal, Injecte, Preservative Fre 12/18/2016   Influenza,inj,Quad PF,6+ Mos 01/10/2018, 12/21/2019   Influenza,inj,quad, With Preservative 12/18/2016   PFIZER(Purple Top)SARS-COV-2 Vaccination 05/07/2019, 05/28/2019   Review of Systems  Constitutional:  Positive for activity change. Negative for appetite change.  HENT:  Positive for sneezing. Negative for ear discharge, ear pain, mouth sores, nosebleeds and trouble swallowing.   Eyes:  Positive for discharge (ephyphora,right eye.). Negative for redness.  Cardiovascular:  Negative for chest pain, palpitations and leg swelling.  Musculoskeletal:  Negative for  gait problem and myalgias.  Skin:  Negative for pallor and rash.  Allergic/Immunologic: Positive for environmental allergies.  Neurological:  Negative for syncope and weakness.  Hematological:  Negative for adenopathy. Does not bruise/bleed easily.  Rest see pertinent positives and negatives per HPI.  Current Outpatient Medications on File Prior to Visit  Medication Sig Dispense Refill   fluticasone (FLONASE) 50 MCG/ACT nasal spray USE 1 SPRAY IN EACH NOSTRIL 2 TIMES DAILY AS NEEDED 48 mL 2   Multiple Vitamin (DAILY VITAMIN) tablet Take 1 tablet by mouth.     No current facility-administered medications on file prior to visit.   Past Medical History:  Diagnosis Date   Allergy    Arthritis    Chicken pox    History of frequent urinary tract infections    Osteopenia    PONV (postoperative nausea and vomiting)    Allergies  Allergen Reactions   Epinephrine     Lightheaded, things spin and "start to go dark", rapid HR per patient.   Latex Rash   Social History   Socioeconomic History   Marital status: Married    Spouse name: Not on file   Number of children: Not on file   Years of education: Not on file   Highest education level: Not on file  Occupational History   Not on file  Tobacco Use   Smoking status: Former   Smokeless tobacco: Never  Substance and Sexual Activity   Alcohol use: Yes    Comment: social   Drug use: No   Sexual activity: Not Currently    Birth control/protection: None  Other Topics Concern   Not on file  Social History Narrative   Not on file   Social Determinants of Health   Financial Resource  Strain: Not on file  Food Insecurity: Not on file  Transportation Needs: Not on file  Physical Activity: Not on file  Stress: Not on file  Social Connections: Not on file   Vitals:   09/01/20 0804  BP: 126/70  Pulse: 83  Resp: 16  SpO2: 97%   Body mass index is 27.74 kg/m.  Physical Exam Vitals and nursing note reviewed.   Constitutional:      General: She is not in acute distress.    Appearance: She is well-developed. She is not ill-appearing.  HENT:     Head: Normocephalic and atraumatic.     Right Ear: Tympanic membrane, ear canal and external ear normal.     Left Ear: Tympanic membrane, ear canal and external ear normal.     Nose: No rhinorrhea.     Comments: Post nasal drainage.    Mouth/Throat:     Mouth: Mucous membranes are moist.     Pharynx: Oropharynx is clear.  Eyes:     Conjunctiva/sclera: Conjunctivae normal.  Cardiovascular:     Rate and Rhythm: Normal rate and regular rhythm.     Heart sounds: No murmur heard. Pulmonary:     Effort: Pulmonary effort is normal. No respiratory distress.     Breath sounds: Normal breath sounds. No stridor. No wheezing, rhonchi or rales.     Comments: Prolonged expiration. Lymphadenopathy:     Cervical: No cervical adenopathy.  Skin:    General: Skin is warm.     Findings: No erythema or rash.  Neurological:     Mental Status: She is alert and oriented to person, place, and time.  Psychiatric:        Speech: Speech normal.     Comments: Well groomed, good eye contact.   ASSESSMENT AND PLAN:  Karen Short was seen today for lingering symptoms from covid.  Diagnoses and all orders for this visit:  Reactive airway disease without asthma Improved. She prefers to hold on short course of prednisone, she will let me know in a few days if she decides to take it x 3 days. Albuterol inh 2 puff every 6 hours for a week then as needed for wheezing or shortness of breath.   Cough Explained that cough and congestion can last a few weeks after acute respiratory symptoms have resolved. It has improved, so she prefers to hold on imaging. Continue OTC Mucinex.  Seasonal allergic rhinitis, unspecified trigger This could be a contributing factor. Continue Flonase nasal spray daily as needed. OTC antihistamine like Zyrtec 10 mg daily may also help.  Travel  advice encounter We discussed some of the vaccination she is going to need, she received several vaccination a few years ago before adoption. Typhoid and yellow fever vaccines + malaria prophylaxis are going to be needed. Safe source of food and bottle water+ mosquito bite prevention. It may be easier to go to a travels clinic a couple weeks before traveling.  Return if symptoms worsen or fail to improve.   Eliani Leclere G. [place], MD  [facility]. [place] office.

## 2020-09-12 DIAGNOSIS — F4323 Adjustment disorder with mixed anxiety and depressed mood: Secondary | ICD-10-CM | POA: Diagnosis not present

## 2020-09-12 DIAGNOSIS — M542 Cervicalgia: Secondary | ICD-10-CM | POA: Diagnosis not present

## 2020-09-12 DIAGNOSIS — M25551 Pain in right hip: Secondary | ICD-10-CM | POA: Diagnosis not present

## 2020-09-15 DIAGNOSIS — Z1231 Encounter for screening mammogram for malignant neoplasm of breast: Secondary | ICD-10-CM | POA: Diagnosis not present

## 2020-09-21 DIAGNOSIS — M542 Cervicalgia: Secondary | ICD-10-CM | POA: Diagnosis not present

## 2020-09-21 DIAGNOSIS — M25551 Pain in right hip: Secondary | ICD-10-CM | POA: Diagnosis not present

## 2020-09-24 ENCOUNTER — Ambulatory Visit (HOSPITAL_COMMUNITY): Payer: Self-pay

## 2020-10-02 DIAGNOSIS — D509 Iron deficiency anemia, unspecified: Secondary | ICD-10-CM | POA: Diagnosis not present

## 2020-10-02 DIAGNOSIS — N939 Abnormal uterine and vaginal bleeding, unspecified: Secondary | ICD-10-CM | POA: Diagnosis not present

## 2020-10-06 DIAGNOSIS — M542 Cervicalgia: Secondary | ICD-10-CM | POA: Diagnosis not present

## 2020-10-06 DIAGNOSIS — M25551 Pain in right hip: Secondary | ICD-10-CM | POA: Diagnosis not present

## 2020-10-20 DIAGNOSIS — M542 Cervicalgia: Secondary | ICD-10-CM | POA: Diagnosis not present

## 2020-10-20 DIAGNOSIS — M25551 Pain in right hip: Secondary | ICD-10-CM | POA: Diagnosis not present

## 2020-10-30 DIAGNOSIS — D649 Anemia, unspecified: Secondary | ICD-10-CM | POA: Diagnosis not present

## 2020-10-30 DIAGNOSIS — D259 Leiomyoma of uterus, unspecified: Secondary | ICD-10-CM | POA: Diagnosis not present

## 2020-11-03 DIAGNOSIS — M542 Cervicalgia: Secondary | ICD-10-CM | POA: Diagnosis not present

## 2020-11-03 DIAGNOSIS — M25551 Pain in right hip: Secondary | ICD-10-CM | POA: Diagnosis not present

## 2020-11-09 DIAGNOSIS — F4323 Adjustment disorder with mixed anxiety and depressed mood: Secondary | ICD-10-CM | POA: Diagnosis not present

## 2020-11-10 ENCOUNTER — Telehealth: Payer: Self-pay | Admitting: Family Medicine

## 2020-11-10 NOTE — Telephone Encounter (Signed)
Patient called because she is travelling abroad and they want to give her vaccinations she believes she has already gotten. Patient would like all immunization records we have on file   Good callback number is [phone]

## 2020-11-10 NOTE — Telephone Encounter (Signed)
Spoke with the patient. She stated that all of her immunizations are not in the chart from her previous provider and that she has been trying to get in touch with them with no luck. While on the phone with her I checked her chart to see if we could find these records. Immunizations are not documented, they are not scanned in under media that I saw and are not on [facility]. I explained to the patient that we could contact her previous providers office to see if we could get those records but that we would need a records release form signed by her. She stated she will try to make it in before closing today to complete the form. Nothing further needed at this time.

## 2020-11-13 DIAGNOSIS — N939 Abnormal uterine and vaginal bleeding, unspecified: Secondary | ICD-10-CM | POA: Diagnosis not present

## 2020-11-20 ENCOUNTER — Telehealth: Payer: Self-pay

## 2020-11-20 ENCOUNTER — Other Ambulatory Visit: Payer: Self-pay

## 2020-11-20 NOTE — Telephone Encounter (Signed)
Patient called asking if immunization records have been received pt is planning to travel out of the country and wants to make sure she is up to date on immunizations. Pt is requesting a call back to discuss.

## 2020-11-20 NOTE — Telephone Encounter (Signed)
Patient has an appointment tomorrow afternoon - I checked your review bin and didn't see any records. I will keep an eye out for them.

## 2020-11-21 ENCOUNTER — Telehealth (INDEPENDENT_AMBULATORY_CARE_PROVIDER_SITE_OTHER): Payer: BC Managed Care – PPO | Admitting: Family Medicine

## 2020-11-21 ENCOUNTER — Encounter: Payer: Self-pay | Admitting: Family Medicine

## 2020-11-21 VITALS — Ht 67.0 in

## 2020-11-21 DIAGNOSIS — M542 Cervicalgia: Secondary | ICD-10-CM | POA: Diagnosis not present

## 2020-11-21 DIAGNOSIS — M25551 Pain in right hip: Secondary | ICD-10-CM | POA: Diagnosis not present

## 2020-11-21 DIAGNOSIS — N938 Other specified abnormal uterine and vaginal bleeding: Secondary | ICD-10-CM | POA: Diagnosis not present

## 2020-11-21 NOTE — Progress Notes (Signed)
Virtual Visit via Video Note I connected with Barney Gertsch Weissberg on 11/21/20 by a video enabled telemedicine application and verified that I am speaking with the correct person using two identifiers.  Location patient: home Location provider:work office Persons participating in the virtual visit: patient, provider  I discussed the limitations of evaluation and management by telemedicine and the availability of in person appointments. The patient expressed understanding and agreed to proceed.  Chief Complaint  Patient presents with   Consult   HPI: Ms. Colin Broach is a 55 year old otherwise healthy female who has been having intermittent episodes of vaginal bleeding. She is following with gynecologist and has failed hormonal therapy, she would like to discussed options. Planning on traveling to [place] later this month, so she is concerned about the risk of DVT if she takes combination OCP's. No current tobacco use, former smoker.  Started with DUB around 05/2019, having intermittent episodes of vaginal bleeding. Menstrual periods of 24 days/8-12 days.  She had pelvic and transvaginal US, diagnosed with fibroids. She has tried initially IUD (06/2020), then OCPs added. These seemed to aggravate problem. IUD moved, attributed to irregularities caused by fibroids. She had IUD removed. Stopped OCP a month ago. LMP a week ago, light.  States that she has discussed a few other option with her gyn, including induction of menopause with "a shot",endometrial ablation, and as a last option hysterectomy.She is not yet considering the latter one, states that she wants to keep her ovaries.  ROS: See pertinent positives and negatives per HPI.  Past Medical History:  Diagnosis Date   Allergy    Arthritis    Chicken pox    History of frequent urinary tract infections    Osteopenia    PONV (postoperative nausea and vomiting)    Past Surgical History:  Procedure Laterality Date   BREAST BIOPSY Left  2008   U/S Core- Benign   BREAST CYST ASPIRATION Left    BREAST CYST ASPIRATION Right    BREAST SURGERY     COLONOSCOPY     30 + yrs in [place]    MENISCUS REPAIR Right 2014   ROBOT ASSISTED MYOMECTOMY  2011   TONSILLECTOMY     Family History  Problem Relation Age of Onset   Arthritis Mother    Hyperlipidemia Mother    Heart disease Mother    Stroke Mother    Hypertension Mother    Diabetes Mother    Hyperlipidemia Father    Heart disease Father    Stroke Father    Hypertension Father    Breast cancer Maternal Aunt    Breast cancer Paternal Aunt    Colon polyps Neg Hx    Colon cancer Neg Hx    Esophageal cancer Neg Hx    Rectal cancer Neg Hx    Stomach cancer Neg Hx     Social History   Socioeconomic History   Marital status: Married    Spouse name: Not on file   Number of children: Not on file   Years of education: Not on file   Highest education level: Not on file  Occupational History   Not on file  Tobacco Use   Smoking status: Former   Smokeless tobacco: Never  Substance and Sexual Activity   Alcohol use: Yes    Comment: social   Drug use: No   Sexual activity: Not Currently    Birth control/protection: None  Other Topics Concern   Not on file  Social History Narrative  Not on file   Social Determinants of Health   Financial Resource Strain: Not on file  Food Insecurity: Not on file  Transportation Needs: Not on file  Physical Activity: Not on file  Stress: Not on file  Social Connections: Not on file  Intimate Partner Violence: Not on file    Current Outpatient Medications:    fluticasone (FLONASE) 50 MCG/ACT nasal spray, USE 1 SPRAY IN EACH NOSTRIL 2 TIMES DAILY AS NEEDED, Disp: 48 mL, Rfl: 2   JUNEL FE 1.5/30 1.5-30 MG-MCG tablet, Take 1 tablet by mouth daily., Disp: , Rfl:    Multiple Vitamin (DAILY VITAMIN) tablet, Take 1 tablet by mouth., Disp: , Rfl:   EXAM:  VITALS per patient if applicable:Ht 5\' 7"  (1.702 m)   BMI 27.74 kg/m    GENERAL: alert, oriented, appears well and in no acute distress  HEENT: atraumatic, conjunctiva clear, no obvious abnormalities on inspection.  NECK: normal movements of the head and neck  LUNGS: on inspection no signs of respiratory distress, breathing rate appears normal, no obvious gross SOB, gasping or wheezing  CV: no obvious cyanosis  PSYCH/NEURO: pleasant and cooperative, no obvious depression or anxiety, speech and thought processing grossly intact  ASSESSMENT AND PLAN:  Discussed the following assessment and plan:  DUB (dysfunctional uterine bleeding) We discussed different treatment options, including progesterone only pill, Depo-Provera, endometrial ablation, combination pills, and as a last treatment option hysterectomy.Explained that oophorectomy is not usually perform with hysterectomy.  Progesterone only pill and Depo-Provera are good option given her concern about thrombotic events while traveling, these may help with vaginal bleeding but there is a potential for vaginal spotting. For DVT prophylaxis while traveling, she can wear compression stocking, walking/moving legs every 1-2 hours,and even Aspirin. She did not make a decision about treatment at this time. She will let me know if she decides to try any oral or injectable progesterone only therapy while thinking about definitive treatment options.  I discussed the assessment and treatment plan with the patient. The patient was provided an opportunity to ask questions and all were answered. The patient agreed with the plan and demonstrated an understanding of the instructions.  Return if symptoms worsen or fail to improve.  Caytlyn Evers , MD

## 2020-11-26 ENCOUNTER — Encounter: Payer: Self-pay | Admitting: Family Medicine

## 2020-12-07 DIAGNOSIS — M25551 Pain in right hip: Secondary | ICD-10-CM | POA: Diagnosis not present

## 2020-12-07 DIAGNOSIS — M542 Cervicalgia: Secondary | ICD-10-CM | POA: Diagnosis not present

## 2020-12-07 DIAGNOSIS — F4323 Adjustment disorder with mixed anxiety and depressed mood: Secondary | ICD-10-CM | POA: Diagnosis not present

## 2020-12-21 DIAGNOSIS — M542 Cervicalgia: Secondary | ICD-10-CM | POA: Diagnosis not present

## 2020-12-21 DIAGNOSIS — M25551 Pain in right hip: Secondary | ICD-10-CM | POA: Diagnosis not present

## 2020-12-26 DIAGNOSIS — F4323 Adjustment disorder with mixed anxiety and depressed mood: Secondary | ICD-10-CM | POA: Diagnosis not present

## 2020-12-28 DIAGNOSIS — M542 Cervicalgia: Secondary | ICD-10-CM | POA: Diagnosis not present

## 2020-12-28 DIAGNOSIS — M25551 Pain in right hip: Secondary | ICD-10-CM | POA: Diagnosis not present

## 2020-12-28 NOTE — Progress Notes (Signed)
HPI: Karen Short is a 55 y.o. female, who is here today for her routine physical and follow up.  Last CPE: 12/21/19.  Regular exercise 3 or more time per week: Not consistently. Following a healthy diet: Not consistent. She lives with her 12 yo daughter.  Chronic medical problems: Allergies and vit D deficiency among some. She sees gyn regularly, Dr Dagoberto Ligas. LMP 2 months ago.  Immunization History  Administered Date(s) Administered   Influenza Inj Mdck Quad Pf 12/18/2016   Influenza, Seasonal, Injecte, Preservative Fre 12/18/2016   Influenza,inj,Quad PF,6+ Mos 01/10/2018, 12/21/2019   Influenza,inj,quad, With Preservative 12/18/2016   Influenza-Unspecified 11/24/2020   PFIZER(Purple Top)SARS-COV-2 Vaccination 05/07/2019, 05/28/2019, 11/14/2020   Health Maintenance  Topic Date Due   COVID-19 Vaccine (4 - Booster for Pfizer series) 01/14/2021 (Originally 01/09/2021)   Zoster Vaccines- Shingrix (1 of 2) 02/23/2026 (Originally 05/24/2016)   TETANUS/TDAP  08/18/2021   MAMMOGRAM  09/07/2021   PAP SMEAR-Modifier  06/21/2023   COLONOSCOPY (Pts 45-76yrs Insurance coverage will need to be confirmed)  03/28/2027   INFLUENZA VACCINE  Completed   Hepatitis C Screening  Completed   HIV Screening  Completed   Pneumococcal Vaccine 4-40 Years old  Aged Out   HPV VACCINES  Aged Out   Breast Bx in 03/2020. Mammogram in 09/2020 at her gyn's office.  Concerns today:   Anxiety:New problem for the past couple months and getting worse. Recently divorce, her daughter is having nightmares, and dealing with stress at work.  Also her 69 yo father ,who lives in [place] had some depression like issue,"wants to die" but he is not suicidal. She has been more irritable. Poor sleep, sleeping about 5 hours. Negative for depressed mood or suicidal thoughts. No prior hx of psychiatric problems.  She is seeing therapist q 2 weeks. She would like to discuss pharmacologic treatment  options.  Vit D deficiency: She is on Vit D supplementation, not sure about dose. Last 25 OH vit D 19 in 12/2019.  She has some questions about CV disease screening. Concerned about risk factors as she gets older. No hx of HTN,HLD,or DM. No hx of tobacco use. FHx positive for CVD,HTN,and DM III. Negative for premature CAD/CVD.  Review of Systems  Constitutional:  Positive for fatigue. Negative for appetite change and fever.  HENT:  Negative for hearing loss, mouth sores, sore throat and trouble swallowing.   Eyes:  Negative for pain and visual disturbance.  Respiratory:  Negative for cough, shortness of breath and wheezing.   Cardiovascular:  Negative for chest pain and leg swelling.  Gastrointestinal:  Negative for abdominal pain, nausea and vomiting.       No changes in bowel habits.  Endocrine: Negative for cold intolerance, heat intolerance, polydipsia, polyphagia and polyuria.  Genitourinary:  Negative for decreased urine volume, dysuria, hematuria, vaginal bleeding and vaginal discharge.  Musculoskeletal:  Negative for gait problem and myalgias.  Skin:  Negative for color change and rash.  Allergic/Immunologic: Positive for environmental allergies.  Neurological:  Negative for syncope, weakness and headaches.  Hematological:  Negative for adenopathy. Does not bruise/bleed easily.  Psychiatric/Behavioral:  Positive for sleep disturbance. Negative for confusion. The patient is nervous/anxious.   All other systems reviewed and are negative.  Current Outpatient Medications on File Prior to Visit  Medication Sig Dispense Refill   Azelaic Acid (FINACEA) 15 % gel Apply topically daily. After skin is thoroughly washed and patted dry, gently but thoroughly massage a thin film of azelaic acid cream into  the affected area twice daily, in the morning and evening.     fluticasone (FLONASE) 50 MCG/ACT nasal spray USE 1 SPRAY IN EACH NOSTRIL 2 TIMES DAILY AS NEEDED 48 mL 2   Multiple Vitamin  (DAILY VITAMIN) tablet Take 1 tablet by mouth.     No current facility-administered medications on file prior to visit.   Past Medical History:  Diagnosis Date   Allergy    Arthritis    Chicken pox    History of frequent urinary tract infections    Osteopenia    PONV (postoperative nausea and vomiting)    Past Surgical History:  Procedure Laterality Date   BREAST BIOPSY Left 2008   U/S Core- Benign   BREAST CYST ASPIRATION Left    BREAST CYST ASPIRATION Right    BREAST SURGERY     COLONOSCOPY     30 + yrs in [place] Right 2014   ROBOT ASSISTED MYOMECTOMY  2011   TONSILLECTOMY     Allergies  Allergen Reactions   Epinephrine     Lightheaded, things spin and "start to go dark", rapid HR per patient.   Latex Rash   Family History  Problem Relation Age of Onset   Arthritis Mother    Hyperlipidemia Mother    Heart disease Mother    Stroke Mother    Hypertension Mother    Diabetes Mother    Hyperlipidemia Father    Heart disease Father    Stroke Father    Hypertension Father    Breast cancer Maternal Aunt    Breast cancer Paternal Aunt    Colon polyps Neg Hx    Colon cancer Neg Hx    Esophageal cancer Neg Hx    Rectal cancer Neg Hx    Stomach cancer Neg Hx    Social History   Socioeconomic History   Marital status: Married    Spouse name: Not on file   Number of children: Not on file   Years of education: Not on file   Highest education level: Not on file  Occupational History   Not on file  Tobacco Use   Smoking status: Former   Smokeless tobacco: Never  Substance and Sexual Activity   Alcohol use: Yes    Comment: social   Drug use: No   Sexual activity: Not Currently    Birth control/protection: None  Other Topics Concern   Not on file  Social History Narrative   Not on file   Social Determinants of Health   Financial Resource Strain: Not on file  Food Insecurity: Not on file  Transportation Needs: Not on file  Physical  Activity: Not on file  Stress: Not on file  Social Connections: Not on file   Vitals:   12/29/20 1041  BP: 118/78  Pulse: 65  Resp: 16  Temp: 97.7 F (36.5 C)  SpO2: 96%   Body mass index is 28.22 kg/m.  Wt Readings from Last 3 Encounters:  12/29/20 180 lb 3.2 oz (81.7 kg)  09/01/20 177 lb 2 oz (80.3 kg)  12/21/19 175 lb 2 oz (79.4 kg)   Physical Exam Vitals and nursing note reviewed.  Constitutional:      General: She is not in acute distress.    Appearance: She is well-developed.  HENT:     Head: Normocephalic and atraumatic.     Right Ear: Hearing, tympanic membrane, ear canal and external ear normal.     Left Ear: Hearing, tympanic  membrane, ear canal and external ear normal.     Mouth/Throat:     Mouth: Mucous membranes are moist.     Pharynx: Oropharynx is clear. Uvula midline.  Eyes:     Extraocular Movements: Extraocular movements intact.     Conjunctiva/sclera: Conjunctivae normal.     Pupils: Pupils are equal, round, and reactive to light.  Neck:     Thyroid: No thyromegaly.     Trachea: No tracheal deviation.  Cardiovascular:     Rate and Rhythm: Normal rate and regular rhythm.     Pulses:          Dorsalis pedis pulses are 2+ on the right side and 2+ on the left side.     Heart sounds: No murmur heard. Pulmonary:     Effort: Pulmonary effort is normal. No respiratory distress.     Breath sounds: Normal breath sounds.  Abdominal:     Palpations: Abdomen is soft. There is no hepatomegaly or mass.     Tenderness: There is no abdominal tenderness.  Genitourinary:    Comments: Deferred to gyn. Musculoskeletal:     Comments: No major deformity or signs of synovitis appreciated.  Lymphadenopathy:     Cervical: No cervical adenopathy.     Upper Body:     Right upper body: No supraclavicular adenopathy.     Left upper body: No supraclavicular adenopathy.  Skin:    General: Skin is warm.     Findings: No erythema or rash.  Neurological:     General:  No focal deficit present.     Mental Status: She is alert and oriented to person, place, and time.     Cranial Nerves: No cranial nerve deficit.     Coordination: Coordination normal.     Gait: Gait normal.     Deep Tendon Reflexes:     Reflex Scores:      Bicep reflexes are 2+ on the right side and 2+ on the left side.      Patellar reflexes are 2+ on the right side and 2+ on the left side. Psychiatric:        Mood and Affect: Mood is anxious. Mood is not depressed.     Comments: Well groomed, good eye contact.   ASSESSMENT AND PLAN:  Ms. Rosebud [facility] was here today annual physical examination and follow up.  Orders Placed This Encounter  Procedures   VITAMIN D 25 Hydroxy (Vit-D Deficiency, Fractures)   Basic metabolic panel   Hemoglobin A1c   Lipid panel   Lab Results  Component Value Date   HGBA1C 5.5 12/29/2020   Lab Results  Component Value Date   CREATININE 0.77 12/29/2020   BUN 15 12/29/2020   NA 137 12/29/2020   K 4.1 12/29/2020   CL 103 12/29/2020   CO2 28 12/29/2020   Lab Results  Component Value Date   CHOL 157 12/29/2020   HDL 76.40 12/29/2020   LDLCALC 69 12/29/2020   TRIG 59.0 12/29/2020   CHOLHDL 2 12/29/2020   Routine general medical examination at a health care facility We discussed the importance of regular physical activity and healthy diet for prevention of chronic illness and/or complications. Preventive guidelines reviewed. Continue female preventive care with her gyn. Vaccination up to date. We discussed current recommendations in regard to CAD/CVD screening. Coronary CT calcium score is an option and can provide some information about CAD. She prefers to hold on this for now.  Ca++ and vit D supplementation recommended.  Next CPE in a year.  The 10-year ASCVD risk score (Arnett DK, et al., 2019) is: 0.8%   Values used to calculate the score:     Age: 69 years     Sex: Female     Is Non-Hispanic African American: No      Diabetic: No     Tobacco smoker: No     Systolic Blood Pressure: 123456 mmHg     Is BP treated: No     HDL Cholesterol: 76.4 mg/dL     Total Cholesterol: 157 mg/dL  Anxiety disorder, unspecified type We discussed a few pharmacologic treatment options, SSRI side effects reviewed. She agrees with low dose Sertraline. Continue CBT. Instructed about warning signs.  -     sertraline (ZOLOFT) 25 MG tablet; Take 1 tablet (25 mg total) by mouth daily.  Screening for lipoid disorders -     Lipid panel  Screening for endocrine, metabolic and immunity disorder -     Hemoglobin A1c -     Basic metabolic panel  Vitamin D deficiency, unspecified Continue current dose of vit D supplementation. Further recommendations according to 25 OH vit D result.  Return in 2 months (on 02/28/2021) for anxiety.  Johntavious Francom G. [place], MD  [facility]. [place] office.

## 2020-12-29 ENCOUNTER — Ambulatory Visit (INDEPENDENT_AMBULATORY_CARE_PROVIDER_SITE_OTHER): Payer: BC Managed Care – PPO | Admitting: Family Medicine

## 2020-12-29 ENCOUNTER — Encounter: Payer: Self-pay | Admitting: Family Medicine

## 2020-12-29 VITALS — BP 118/78 | HR 65 | Temp 97.7°F | Resp 16 | Ht 67.0 in | Wt 180.2 lb

## 2020-12-29 DIAGNOSIS — Z130 Encounter for screening for diseases of the blood and blood-forming organs and certain disorders involving the immune mechanism: Secondary | ICD-10-CM

## 2020-12-29 DIAGNOSIS — Z0000 Encounter for general adult medical examination without abnormal findings: Secondary | ICD-10-CM | POA: Diagnosis not present

## 2020-12-29 DIAGNOSIS — Z1329 Encounter for screening for other suspected endocrine disorder: Secondary | ICD-10-CM | POA: Diagnosis not present

## 2020-12-29 DIAGNOSIS — Z13228 Encounter for screening for other metabolic disorders: Secondary | ICD-10-CM | POA: Diagnosis not present

## 2020-12-29 DIAGNOSIS — E559 Vitamin D deficiency, unspecified: Secondary | ICD-10-CM

## 2020-12-29 DIAGNOSIS — Z13220 Encounter for screening for lipoid disorders: Secondary | ICD-10-CM | POA: Diagnosis not present

## 2020-12-29 DIAGNOSIS — F419 Anxiety disorder, unspecified: Secondary | ICD-10-CM

## 2020-12-29 LAB — BASIC METABOLIC PANEL
BUN: 15 mg/dL (ref 6–23)
CO2: 28 mEq/L (ref 19–32)
Calcium: 9.1 mg/dL (ref 8.4–10.5)
Chloride: 103 mEq/L (ref 96–112)
Creatinine, Ser: 0.77 mg/dL (ref 0.40–1.20)
GFR: 87.34 mL/min (ref >60.00)
Glucose, Bld: 81 mg/dL (ref 70–99)
Potassium: 4.1 mEq/L (ref 3.5–5.1)
Sodium: 137 mEq/L (ref 135–145)

## 2020-12-29 LAB — LIPID PANEL
Cholesterol: 157 mg/dL (ref 0–200)
HDL: 76.4 mg/dL (ref >39.00)
LDL Cholesterol: 69 mg/dL (ref 0–99)
NonHDL: 80.43
Total CHOL/HDL Ratio: 2
Triglycerides: 59 mg/dL (ref 0.0–149.0)
VLDL: 11.8 mg/dL (ref 0.0–40.0)

## 2020-12-29 LAB — VITAMIN D 25 HYDROXY (VIT D DEFICIENCY, FRACTURES): VITD: 51.25 ng/mL (ref 30.00–100.00)

## 2020-12-29 LAB — HEMOGLOBIN A1C: Hgb A1c MFr Bld: 5.5 % (ref 4.6–6.5)

## 2020-12-29 MED ORDER — SERTRALINE HCL 25 MG PO TABS: 25.0000 mg | ORAL_TABLET | Freq: Every day | ORAL | 1 refills | Status: DC

## 2020-12-29 NOTE — Patient Instructions (Signed)
A few things to remember from today's visit:  Routine general medical examination at a health care facility  Anxiety disorder, unspecified type - Plan: sertraline (ZOLOFT) 25 MG tablet  Screening for lipoid disorders - Plan: Lipid panel  Screening for endocrine, metabolic and immunity disorder - Plan: Basic metabolic panel, Hemoglobin A1c  Vitamin D deficiency, unspecified - Plan: VITAMIN D 25 Hydroxy (Vit-D Deficiency, Fractures)  If you need refills please call your pharmacy. Do not use My Chart to request refills or for acute issues that need immediate attention.  Sertraline 25 mg started today.   Please be sure medication list is accurate. If a new problem present, please set up appointment sooner than planned today. Today you have you routine preventive visit.  At least 150 minutes of moderate exercise per week, daily brisk walking for 15-30 min is a good exercise option. Healthy diet low in saturated (animal) fats and sweets and consisting of fresh fruits and vegetables, lean meats such as fish and white chicken and whole grains.  These are some of recommendations for screening depending of age and risk factors:  - Vaccines:  Tdap vaccine every 10 years.  Shingles vaccine recommended at age 24, could be given after 54 years of age but not sure about insurance coverage.   Pneumonia vaccines: Pneumovax at 65. Sometimes Pneumovax is giving earlier if history of smoking, lung disease,diabetes,kidney disease among some.  Screening for diabetes at age 78 and every 3 years.  Cervical cancer prevention:  Pap smear starts at 55 years of age and continues periodically until 54 years old in low risk women. Pap smear every 3 years between 93 and 16 years old. Pap smear every 3-5 years between women 30 and older if pap smear negative and HPV screening negative.   -Breast cancer: Mammogram: There is disagreement between experts about when to start screening in low risk asymptomatic  female but recent recommendations are to start screening at 48 and not later than 54 years old , every 1-2 years and after 54 yo q 2 years. Screening is recommended until 54 years old but some women can continue screening depending of healthy issues.  Colon cancer screening: Has been recently changed to 54 yo. Insurance may not cover until you are 54 years old. Screening is recommended until 54 years old.  Cholesterol disorder screening at age 74 and every 3 years.  Also recommended:  Dental visit- Brush and floss your teeth twice daily; visit your dentist twice a year. Eye doctor- Get an eye exam at least every 2 years. Helmet use- Always wear a helmet when riding a bicycle, motorcycle, rollerblading or skateboarding. Safe sex- If you may be exposed to sexually transmitted infections, use a condom. Seat belts- Seat belts can save your live; always wear one. Smoke/Carbon Monoxide detectors- These detectors need to be installed on the appropriate level of your home. Replace batteries at least once a year. Skin cancer- When out in the sun please cover up and use sunscreen 15 SPF or higher. Violence- If anyone is threatening or hurting you, please tell your healthcare provider.  Drink alcohol in moderation- Limit alcohol intake to one drink or less per day. Never drink and drive. Calcium supplementation 1000 to 1200 mg daily, ideally through your diet.  Vitamin D supplementation 800 units daily.

## 2021-01-02 DIAGNOSIS — M25551 Pain in right hip: Secondary | ICD-10-CM | POA: Diagnosis not present

## 2021-01-02 DIAGNOSIS — M542 Cervicalgia: Secondary | ICD-10-CM | POA: Diagnosis not present

## 2021-01-05 DIAGNOSIS — L718 Other rosacea: Secondary | ICD-10-CM | POA: Diagnosis not present

## 2021-01-05 DIAGNOSIS — D2372 Other benign neoplasm of skin of left lower limb, including hip: Secondary | ICD-10-CM | POA: Diagnosis not present

## 2021-01-05 DIAGNOSIS — L814 Other melanin hyperpigmentation: Secondary | ICD-10-CM | POA: Diagnosis not present

## 2021-01-05 DIAGNOSIS — D225 Melanocytic nevi of trunk: Secondary | ICD-10-CM | POA: Diagnosis not present

## 2021-01-09 DIAGNOSIS — M25551 Pain in right hip: Secondary | ICD-10-CM | POA: Diagnosis not present

## 2021-01-09 DIAGNOSIS — M542 Cervicalgia: Secondary | ICD-10-CM | POA: Diagnosis not present

## 2021-01-18 DIAGNOSIS — M542 Cervicalgia: Secondary | ICD-10-CM | POA: Diagnosis not present

## 2021-01-18 DIAGNOSIS — F4323 Adjustment disorder with mixed anxiety and depressed mood: Secondary | ICD-10-CM | POA: Diagnosis not present

## 2021-01-18 DIAGNOSIS — M25551 Pain in right hip: Secondary | ICD-10-CM | POA: Diagnosis not present

## 2021-01-22 ENCOUNTER — Other Ambulatory Visit: Payer: Self-pay

## 2021-01-22 DIAGNOSIS — F419 Anxiety disorder, unspecified: Secondary | ICD-10-CM

## 2021-01-22 MED ORDER — SERTRALINE HCL 25 MG PO TABS: 25.0000 mg | ORAL_TABLET | Freq: Every day | ORAL | 0 refills | Status: DC

## 2021-01-23 DIAGNOSIS — M25551 Pain in right hip: Secondary | ICD-10-CM | POA: Diagnosis not present

## 2021-01-23 DIAGNOSIS — M542 Cervicalgia: Secondary | ICD-10-CM | POA: Diagnosis not present

## 2021-02-02 DIAGNOSIS — M542 Cervicalgia: Secondary | ICD-10-CM | POA: Diagnosis not present

## 2021-02-02 DIAGNOSIS — M25551 Pain in right hip: Secondary | ICD-10-CM | POA: Diagnosis not present

## 2021-02-05 DIAGNOSIS — F4323 Adjustment disorder with mixed anxiety and depressed mood: Secondary | ICD-10-CM | POA: Diagnosis not present

## 2021-02-08 DIAGNOSIS — M25551 Pain in right hip: Secondary | ICD-10-CM | POA: Diagnosis not present

## 2021-02-08 DIAGNOSIS — M542 Cervicalgia: Secondary | ICD-10-CM | POA: Diagnosis not present

## 2021-03-12 DIAGNOSIS — F4323 Adjustment disorder with mixed anxiety and depressed mood: Secondary | ICD-10-CM | POA: Diagnosis not present

## 2021-03-16 DIAGNOSIS — M25551 Pain in right hip: Secondary | ICD-10-CM | POA: Diagnosis not present

## 2021-03-16 DIAGNOSIS — M542 Cervicalgia: Secondary | ICD-10-CM | POA: Diagnosis not present

## 2021-03-29 DIAGNOSIS — M542 Cervicalgia: Secondary | ICD-10-CM | POA: Diagnosis not present

## 2021-03-29 DIAGNOSIS — M25551 Pain in right hip: Secondary | ICD-10-CM | POA: Diagnosis not present

## 2021-03-30 DIAGNOSIS — J3489 Other specified disorders of nose and nasal sinuses: Secondary | ICD-10-CM | POA: Diagnosis not present

## 2021-03-30 DIAGNOSIS — L719 Rosacea, unspecified: Secondary | ICD-10-CM | POA: Diagnosis not present

## 2021-03-30 DIAGNOSIS — B9689 Other specified bacterial agents as the cause of diseases classified elsewhere: Secondary | ICD-10-CM | POA: Diagnosis not present

## 2021-03-30 DIAGNOSIS — L089 Local infection of the skin and subcutaneous tissue, unspecified: Secondary | ICD-10-CM | POA: Diagnosis not present

## 2021-04-04 DIAGNOSIS — F4323 Adjustment disorder with mixed anxiety and depressed mood: Secondary | ICD-10-CM | POA: Diagnosis not present

## 2021-04-19 DIAGNOSIS — F4323 Adjustment disorder with mixed anxiety and depressed mood: Secondary | ICD-10-CM | POA: Diagnosis not present

## 2021-04-20 DIAGNOSIS — M25551 Pain in right hip: Secondary | ICD-10-CM | POA: Diagnosis not present

## 2021-04-20 DIAGNOSIS — M542 Cervicalgia: Secondary | ICD-10-CM | POA: Diagnosis not present

## 2021-05-02 ENCOUNTER — Other Ambulatory Visit: Payer: Self-pay | Admitting: Family Medicine

## 2021-05-02 DIAGNOSIS — F4323 Adjustment disorder with mixed anxiety and depressed mood: Secondary | ICD-10-CM | POA: Diagnosis not present

## 2021-05-02 DIAGNOSIS — F419 Anxiety disorder, unspecified: Secondary | ICD-10-CM

## 2021-05-11 DIAGNOSIS — M542 Cervicalgia: Secondary | ICD-10-CM | POA: Diagnosis not present

## 2021-05-11 DIAGNOSIS — M25551 Pain in right hip: Secondary | ICD-10-CM | POA: Diagnosis not present

## 2021-05-18 DIAGNOSIS — M542 Cervicalgia: Secondary | ICD-10-CM | POA: Diagnosis not present

## 2021-05-18 DIAGNOSIS — M25551 Pain in right hip: Secondary | ICD-10-CM | POA: Diagnosis not present

## 2021-05-21 DIAGNOSIS — B9689 Other specified bacterial agents as the cause of diseases classified elsewhere: Secondary | ICD-10-CM | POA: Diagnosis not present

## 2021-05-21 DIAGNOSIS — J3489 Other specified disorders of nose and nasal sinuses: Secondary | ICD-10-CM | POA: Diagnosis not present

## 2021-05-21 DIAGNOSIS — L719 Rosacea, unspecified: Secondary | ICD-10-CM | POA: Diagnosis not present

## 2021-05-21 DIAGNOSIS — L089 Local infection of the skin and subcutaneous tissue, unspecified: Secondary | ICD-10-CM | POA: Diagnosis not present

## 2021-05-23 DIAGNOSIS — F4323 Adjustment disorder with mixed anxiety and depressed mood: Secondary | ICD-10-CM | POA: Diagnosis not present

## 2021-05-31 DIAGNOSIS — M25551 Pain in right hip: Secondary | ICD-10-CM | POA: Diagnosis not present

## 2021-05-31 DIAGNOSIS — M542 Cervicalgia: Secondary | ICD-10-CM | POA: Diagnosis not present

## 2021-06-07 DIAGNOSIS — L0109 Other impetigo: Secondary | ICD-10-CM | POA: Diagnosis not present

## 2021-06-07 DIAGNOSIS — L821 Other seborrheic keratosis: Secondary | ICD-10-CM | POA: Diagnosis not present

## 2021-06-07 DIAGNOSIS — L0889 Other specified local infections of the skin and subcutaneous tissue: Secondary | ICD-10-CM | POA: Diagnosis not present

## 2021-06-07 DIAGNOSIS — F4323 Adjustment disorder with mixed anxiety and depressed mood: Secondary | ICD-10-CM | POA: Diagnosis not present

## 2021-06-07 DIAGNOSIS — D692 Other nonthrombocytopenic purpura: Secondary | ICD-10-CM | POA: Diagnosis not present

## 2021-06-15 DIAGNOSIS — M542 Cervicalgia: Secondary | ICD-10-CM | POA: Diagnosis not present

## 2021-06-15 DIAGNOSIS — M25551 Pain in right hip: Secondary | ICD-10-CM | POA: Diagnosis not present

## 2021-06-29 DIAGNOSIS — M9901 Segmental and somatic dysfunction of cervical region: Secondary | ICD-10-CM | POA: Diagnosis not present

## 2021-06-29 DIAGNOSIS — M9903 Segmental and somatic dysfunction of lumbar region: Secondary | ICD-10-CM | POA: Diagnosis not present

## 2021-06-29 DIAGNOSIS — G44209 Tension-type headache, unspecified, not intractable: Secondary | ICD-10-CM | POA: Diagnosis not present

## 2021-06-29 DIAGNOSIS — M25512 Pain in left shoulder: Secondary | ICD-10-CM | POA: Diagnosis not present

## 2021-07-02 DIAGNOSIS — F4323 Adjustment disorder with mixed anxiety and depressed mood: Secondary | ICD-10-CM | POA: Diagnosis not present

## 2021-07-04 DIAGNOSIS — M9903 Segmental and somatic dysfunction of lumbar region: Secondary | ICD-10-CM | POA: Diagnosis not present

## 2021-07-04 DIAGNOSIS — M9901 Segmental and somatic dysfunction of cervical region: Secondary | ICD-10-CM | POA: Diagnosis not present

## 2021-07-04 DIAGNOSIS — M25512 Pain in left shoulder: Secondary | ICD-10-CM | POA: Diagnosis not present

## 2021-07-04 DIAGNOSIS — G44209 Tension-type headache, unspecified, not intractable: Secondary | ICD-10-CM | POA: Diagnosis not present

## 2021-07-06 DIAGNOSIS — M542 Cervicalgia: Secondary | ICD-10-CM | POA: Diagnosis not present

## 2021-07-06 DIAGNOSIS — M25551 Pain in right hip: Secondary | ICD-10-CM | POA: Diagnosis not present

## 2021-07-09 DIAGNOSIS — M25512 Pain in left shoulder: Secondary | ICD-10-CM | POA: Diagnosis not present

## 2021-07-09 DIAGNOSIS — G44209 Tension-type headache, unspecified, not intractable: Secondary | ICD-10-CM | POA: Diagnosis not present

## 2021-07-09 DIAGNOSIS — M9901 Segmental and somatic dysfunction of cervical region: Secondary | ICD-10-CM | POA: Diagnosis not present

## 2021-07-09 DIAGNOSIS — M9903 Segmental and somatic dysfunction of lumbar region: Secondary | ICD-10-CM | POA: Diagnosis not present

## 2021-07-20 DIAGNOSIS — M9903 Segmental and somatic dysfunction of lumbar region: Secondary | ICD-10-CM | POA: Diagnosis not present

## 2021-07-20 DIAGNOSIS — G44209 Tension-type headache, unspecified, not intractable: Secondary | ICD-10-CM | POA: Diagnosis not present

## 2021-07-20 DIAGNOSIS — M9901 Segmental and somatic dysfunction of cervical region: Secondary | ICD-10-CM | POA: Diagnosis not present

## 2021-07-20 DIAGNOSIS — M25512 Pain in left shoulder: Secondary | ICD-10-CM | POA: Diagnosis not present

## 2021-07-28 ENCOUNTER — Other Ambulatory Visit: Payer: Self-pay | Admitting: Family Medicine

## 2021-07-28 DIAGNOSIS — J302 Other seasonal allergic rhinitis: Secondary | ICD-10-CM

## 2021-07-30 DIAGNOSIS — Z01419 Encounter for gynecological examination (general) (routine) without abnormal findings: Secondary | ICD-10-CM | POA: Diagnosis not present

## 2021-07-30 DIAGNOSIS — Z6829 Body mass index (BMI) 29.0-29.9, adult: Secondary | ICD-10-CM | POA: Diagnosis not present

## 2021-08-03 DIAGNOSIS — M9902 Segmental and somatic dysfunction of thoracic region: Secondary | ICD-10-CM | POA: Diagnosis not present

## 2021-08-03 DIAGNOSIS — M9903 Segmental and somatic dysfunction of lumbar region: Secondary | ICD-10-CM | POA: Diagnosis not present

## 2021-08-03 DIAGNOSIS — M9904 Segmental and somatic dysfunction of sacral region: Secondary | ICD-10-CM | POA: Diagnosis not present

## 2021-08-03 DIAGNOSIS — M9901 Segmental and somatic dysfunction of cervical region: Secondary | ICD-10-CM | POA: Diagnosis not present

## 2021-08-06 DIAGNOSIS — F4323 Adjustment disorder with mixed anxiety and depressed mood: Secondary | ICD-10-CM | POA: Diagnosis not present

## 2021-08-10 ENCOUNTER — Encounter: Payer: Self-pay | Admitting: Family Medicine

## 2021-08-10 ENCOUNTER — Ambulatory Visit: Payer: BC Managed Care – PPO | Admitting: Family Medicine

## 2021-08-10 VITALS — BP 128/86 | HR 77 | Temp 98.8°F | Wt 183.4 lb

## 2021-08-10 DIAGNOSIS — T3695XA Adverse effect of unspecified systemic antibiotic, initial encounter: Secondary | ICD-10-CM | POA: Diagnosis not present

## 2021-08-10 DIAGNOSIS — M542 Cervicalgia: Secondary | ICD-10-CM | POA: Diagnosis not present

## 2021-08-10 DIAGNOSIS — B379 Candidiasis, unspecified: Secondary | ICD-10-CM

## 2021-08-10 DIAGNOSIS — M25551 Pain in right hip: Secondary | ICD-10-CM | POA: Diagnosis not present

## 2021-08-10 DIAGNOSIS — L089 Local infection of the skin and subcutaneous tissue, unspecified: Secondary | ICD-10-CM | POA: Diagnosis not present

## 2021-08-10 MED ORDER — FLUCONAZOLE 150 MG PO TABS: 150.0000 mg | ORAL_TABLET | Freq: Once | ORAL | 0 refills | Status: AC

## 2021-08-10 MED ORDER — CIPROFLOXACIN HCL 500 MG PO TABS: 500.0000 mg | ORAL_TABLET | Freq: Two times a day (BID) | ORAL | 0 refills | Status: AC

## 2021-08-17 DIAGNOSIS — M9902 Segmental and somatic dysfunction of thoracic region: Secondary | ICD-10-CM | POA: Diagnosis not present

## 2021-08-17 DIAGNOSIS — M9901 Segmental and somatic dysfunction of cervical region: Secondary | ICD-10-CM | POA: Diagnosis not present

## 2021-08-17 DIAGNOSIS — M9904 Segmental and somatic dysfunction of sacral region: Secondary | ICD-10-CM | POA: Diagnosis not present

## 2021-08-17 DIAGNOSIS — M9903 Segmental and somatic dysfunction of lumbar region: Secondary | ICD-10-CM | POA: Diagnosis not present

## 2021-08-28 DIAGNOSIS — F4323 Adjustment disorder with mixed anxiety and depressed mood: Secondary | ICD-10-CM | POA: Diagnosis not present

## 2021-08-29 DIAGNOSIS — N921 Excessive and frequent menstruation with irregular cycle: Secondary | ICD-10-CM | POA: Diagnosis not present

## 2021-08-29 DIAGNOSIS — D251 Intramural leiomyoma of uterus: Secondary | ICD-10-CM | POA: Diagnosis not present

## 2021-08-29 DIAGNOSIS — D250 Submucous leiomyoma of uterus: Secondary | ICD-10-CM | POA: Diagnosis not present

## 2021-08-31 DIAGNOSIS — M9904 Segmental and somatic dysfunction of sacral region: Secondary | ICD-10-CM | POA: Diagnosis not present

## 2021-08-31 DIAGNOSIS — M9902 Segmental and somatic dysfunction of thoracic region: Secondary | ICD-10-CM | POA: Diagnosis not present

## 2021-08-31 DIAGNOSIS — M9903 Segmental and somatic dysfunction of lumbar region: Secondary | ICD-10-CM | POA: Diagnosis not present

## 2021-08-31 DIAGNOSIS — M9901 Segmental and somatic dysfunction of cervical region: Secondary | ICD-10-CM | POA: Diagnosis not present

## 2021-09-07 DIAGNOSIS — M9903 Segmental and somatic dysfunction of lumbar region: Secondary | ICD-10-CM | POA: Diagnosis not present

## 2021-09-07 DIAGNOSIS — M9902 Segmental and somatic dysfunction of thoracic region: Secondary | ICD-10-CM | POA: Diagnosis not present

## 2021-09-07 DIAGNOSIS — M9901 Segmental and somatic dysfunction of cervical region: Secondary | ICD-10-CM | POA: Diagnosis not present

## 2021-09-07 DIAGNOSIS — M9904 Segmental and somatic dysfunction of sacral region: Secondary | ICD-10-CM | POA: Diagnosis not present

## 2021-09-18 HISTORY — PX: HYSTEROSCOPY WITH D & C: SHX1775

## 2021-09-21 DIAGNOSIS — M9901 Segmental and somatic dysfunction of cervical region: Secondary | ICD-10-CM | POA: Diagnosis not present

## 2021-09-21 DIAGNOSIS — M9902 Segmental and somatic dysfunction of thoracic region: Secondary | ICD-10-CM | POA: Diagnosis not present

## 2021-09-21 DIAGNOSIS — M9903 Segmental and somatic dysfunction of lumbar region: Secondary | ICD-10-CM | POA: Diagnosis not present

## 2021-09-21 DIAGNOSIS — M9904 Segmental and somatic dysfunction of sacral region: Secondary | ICD-10-CM | POA: Diagnosis not present

## 2021-09-24 DIAGNOSIS — Z13820 Encounter for screening for osteoporosis: Secondary | ICD-10-CM | POA: Diagnosis not present

## 2021-09-24 DIAGNOSIS — Z1231 Encounter for screening mammogram for malignant neoplasm of breast: Secondary | ICD-10-CM | POA: Diagnosis not present

## 2021-09-26 ENCOUNTER — Other Ambulatory Visit: Payer: Self-pay | Admitting: Obstetrics and Gynecology

## 2021-09-26 DIAGNOSIS — R928 Other abnormal and inconclusive findings on diagnostic imaging of breast: Secondary | ICD-10-CM

## 2021-09-27 DIAGNOSIS — D259 Leiomyoma of uterus, unspecified: Secondary | ICD-10-CM | POA: Diagnosis not present

## 2021-09-27 DIAGNOSIS — N921 Excessive and frequent menstruation with irregular cycle: Secondary | ICD-10-CM | POA: Diagnosis not present

## 2021-10-03 DIAGNOSIS — M9903 Segmental and somatic dysfunction of lumbar region: Secondary | ICD-10-CM | POA: Diagnosis not present

## 2021-10-03 DIAGNOSIS — M9904 Segmental and somatic dysfunction of sacral region: Secondary | ICD-10-CM | POA: Diagnosis not present

## 2021-10-03 DIAGNOSIS — M9901 Segmental and somatic dysfunction of cervical region: Secondary | ICD-10-CM | POA: Diagnosis not present

## 2021-10-03 DIAGNOSIS — M9902 Segmental and somatic dysfunction of thoracic region: Secondary | ICD-10-CM | POA: Diagnosis not present

## 2021-10-12 DIAGNOSIS — M9904 Segmental and somatic dysfunction of sacral region: Secondary | ICD-10-CM | POA: Diagnosis not present

## 2021-10-12 DIAGNOSIS — M9902 Segmental and somatic dysfunction of thoracic region: Secondary | ICD-10-CM | POA: Diagnosis not present

## 2021-10-12 DIAGNOSIS — M9901 Segmental and somatic dysfunction of cervical region: Secondary | ICD-10-CM | POA: Diagnosis not present

## 2021-10-12 DIAGNOSIS — M9903 Segmental and somatic dysfunction of lumbar region: Secondary | ICD-10-CM | POA: Diagnosis not present

## 2021-10-16 ENCOUNTER — Ambulatory Visit
Admission: RE | Admit: 2021-10-16 | Discharge: 2021-10-16 | Disposition: A | Discharge status: Home / Self Care | Payer: BC Managed Care – PPO | Source: Ambulatory Visit | Attending: Obstetrics and Gynecology | Admitting: Obstetrics and Gynecology

## 2021-10-16 ENCOUNTER — Other Ambulatory Visit: Payer: Self-pay | Admitting: Obstetrics and Gynecology

## 2021-10-16 DIAGNOSIS — N6341 Unspecified lump in right breast, subareolar: Secondary | ICD-10-CM | POA: Diagnosis not present

## 2021-10-16 DIAGNOSIS — R928 Other abnormal and inconclusive findings on diagnostic imaging of breast: Secondary | ICD-10-CM

## 2021-10-16 DIAGNOSIS — N6310 Unspecified lump in the right breast, unspecified quadrant: Secondary | ICD-10-CM

## 2021-10-23 ENCOUNTER — Other Ambulatory Visit: Payer: BC Managed Care – PPO

## 2021-10-30 ENCOUNTER — Other Ambulatory Visit: Payer: BC Managed Care – PPO

## 2021-10-30 DIAGNOSIS — M25551 Pain in right hip: Secondary | ICD-10-CM | POA: Diagnosis not present

## 2021-10-30 DIAGNOSIS — M542 Cervicalgia: Secondary | ICD-10-CM | POA: Diagnosis not present

## 2021-11-01 ENCOUNTER — Other Ambulatory Visit: Payer: Self-pay | Admitting: Obstetrics and Gynecology

## 2021-11-01 DIAGNOSIS — Z803 Family history of malignant neoplasm of breast: Secondary | ICD-10-CM

## 2021-11-02 DIAGNOSIS — M9904 Segmental and somatic dysfunction of sacral region: Secondary | ICD-10-CM | POA: Diagnosis not present

## 2021-11-02 DIAGNOSIS — M9903 Segmental and somatic dysfunction of lumbar region: Secondary | ICD-10-CM | POA: Diagnosis not present

## 2021-11-02 DIAGNOSIS — M9901 Segmental and somatic dysfunction of cervical region: Secondary | ICD-10-CM | POA: Diagnosis not present

## 2021-11-02 DIAGNOSIS — M9902 Segmental and somatic dysfunction of thoracic region: Secondary | ICD-10-CM | POA: Diagnosis not present

## 2021-11-14 ENCOUNTER — Other Ambulatory Visit: Payer: BC Managed Care – PPO

## 2021-11-19 ENCOUNTER — Ambulatory Visit
Admission: RE | Admit: 2021-11-19 | Discharge: 2021-11-19 | Disposition: A | Discharge status: Home / Self Care | Payer: BC Managed Care – PPO | Source: Ambulatory Visit | Attending: Obstetrics and Gynecology | Admitting: Obstetrics and Gynecology

## 2021-11-19 DIAGNOSIS — N6310 Unspecified lump in the right breast, unspecified quadrant: Secondary | ICD-10-CM

## 2021-11-21 DIAGNOSIS — H524 Presbyopia: Secondary | ICD-10-CM | POA: Diagnosis not present

## 2021-11-21 DIAGNOSIS — H5213 Myopia, bilateral: Secondary | ICD-10-CM | POA: Diagnosis not present

## 2021-11-30 DIAGNOSIS — M25551 Pain in right hip: Secondary | ICD-10-CM | POA: Diagnosis not present

## 2021-11-30 DIAGNOSIS — M9901 Segmental and somatic dysfunction of cervical region: Secondary | ICD-10-CM | POA: Diagnosis not present

## 2021-11-30 DIAGNOSIS — M9903 Segmental and somatic dysfunction of lumbar region: Secondary | ICD-10-CM | POA: Diagnosis not present

## 2021-11-30 DIAGNOSIS — M9904 Segmental and somatic dysfunction of sacral region: Secondary | ICD-10-CM | POA: Diagnosis not present

## 2021-11-30 DIAGNOSIS — M542 Cervicalgia: Secondary | ICD-10-CM | POA: Diagnosis not present

## 2021-11-30 DIAGNOSIS — M9902 Segmental and somatic dysfunction of thoracic region: Secondary | ICD-10-CM | POA: Diagnosis not present

## 2021-12-21 DIAGNOSIS — M9904 Segmental and somatic dysfunction of sacral region: Secondary | ICD-10-CM | POA: Diagnosis not present

## 2021-12-21 DIAGNOSIS — M9903 Segmental and somatic dysfunction of lumbar region: Secondary | ICD-10-CM | POA: Diagnosis not present

## 2021-12-21 DIAGNOSIS — M9902 Segmental and somatic dysfunction of thoracic region: Secondary | ICD-10-CM | POA: Diagnosis not present

## 2021-12-21 DIAGNOSIS — M9901 Segmental and somatic dysfunction of cervical region: Secondary | ICD-10-CM | POA: Diagnosis not present

## 2022-01-04 DIAGNOSIS — M542 Cervicalgia: Secondary | ICD-10-CM | POA: Diagnosis not present

## 2022-01-04 DIAGNOSIS — M25551 Pain in right hip: Secondary | ICD-10-CM | POA: Diagnosis not present

## 2022-01-09 DIAGNOSIS — M9901 Segmental and somatic dysfunction of cervical region: Secondary | ICD-10-CM | POA: Diagnosis not present

## 2022-01-09 DIAGNOSIS — M9902 Segmental and somatic dysfunction of thoracic region: Secondary | ICD-10-CM | POA: Diagnosis not present

## 2022-01-09 DIAGNOSIS — M9904 Segmental and somatic dysfunction of sacral region: Secondary | ICD-10-CM | POA: Diagnosis not present

## 2022-01-09 DIAGNOSIS — M9903 Segmental and somatic dysfunction of lumbar region: Secondary | ICD-10-CM | POA: Diagnosis not present

## 2022-01-17 DIAGNOSIS — D225 Melanocytic nevi of trunk: Secondary | ICD-10-CM | POA: Diagnosis not present

## 2022-01-17 DIAGNOSIS — L821 Other seborrheic keratosis: Secondary | ICD-10-CM | POA: Diagnosis not present

## 2022-01-17 DIAGNOSIS — L738 Other specified follicular disorders: Secondary | ICD-10-CM | POA: Diagnosis not present

## 2022-01-17 DIAGNOSIS — L814 Other melanin hyperpigmentation: Secondary | ICD-10-CM | POA: Diagnosis not present

## 2022-01-22 DIAGNOSIS — M25551 Pain in right hip: Secondary | ICD-10-CM | POA: Diagnosis not present

## 2022-01-22 DIAGNOSIS — M542 Cervicalgia: Secondary | ICD-10-CM | POA: Diagnosis not present

## 2022-01-23 NOTE — Progress Notes (Unsigned)
HPI: [name] is a 56 y.o. female, who is here today for her routine physical.  Last CPE: 12/29/2020 The patient reports exercising intermittently, walking one mile daily with her dog. She admits to not following a consistently healthy diet, consuming processed foods but choosing healthier options and eating vegetables daily. The patient states she sleeps an average of five to five and a half hours per night. Regular exercise 3 or more time per week: *** Following a healthy diet: ***  Chronic medical problems: ***  Immunization History  Administered Date(s) Administered   Influenza Inj Mdck Quad Pf 12/18/2016, 10/28/2021   Influenza, Seasonal, Injecte, Preservative Fre 12/18/2016   Influenza,inj,Quad PF,6+ Mos 01/10/2018, 12/21/2019   Influenza,inj,quad, With Preservative 12/18/2016   Influenza-Unspecified 11/24/2020   PFIZER Comirnaty(Gray Top)Covid-19 Tri-Sucrose Vaccine 11/02/2021   PFIZER(Purple Top)SARS-COV-2 Vaccination 05/07/2019, 05/28/2019, 11/14/2020   Health Maintenance  Topic Date Due   DTaP/Tdap/Td (1 - Tdap) Never done   MAMMOGRAM  09/07/2021   COVID-19 Vaccine (5 - 2023-24 season) 12/28/2021   Zoster Vaccines- Shingrix (1 of 2) 02/23/2026 (Originally 05/24/2016)   PAP SMEAR-Modifier  06/21/2023   COLONOSCOPY (Pts 45-14yrs Insurance coverage will need to be confirmed)  03/28/2027   INFLUENZA VACCINE  Completed   Hepatitis C Screening  Completed   HIV Screening  Completed   HPV VACCINES  Aged Out     The patient had a colonoscopy in 2019 and a mammogram this year. She mentions having a hysterotomy in August to remove fibroids and undergoing a D&C. The patient was on continuous birth control but stopped three weeks ago, and has experienced spotting but no official menstrual period since then. She has an upcoming appointment with her gynecologist to test [facility] levels.  The patient received a flu shot in September and had a previous episode of shingles.  Her last tetanus shot was in 2013. She is currently taking sertraline, tonifamil, amitriptyline, vitamin D, and calcium. The patient had a mammogram and biopsy on October 16, 2021, which were clear.  The patient inquires about a possible fungal infection on her toenails and denies smoking or consuming excessive alcohol. She has *** concerns today.  Review of Systems  Constitutional:  Negative for activity change, appetite change and fever.  HENT:  Negative for hearing loss, mouth sores, sore throat and trouble swallowing.   Eyes:  Negative for redness and visual disturbance.  Respiratory:  Negative for cough, shortness of breath and wheezing.   Cardiovascular:  Negative for chest pain and leg swelling.  Gastrointestinal:  Negative for abdominal pain, nausea and vomiting.       No changes in bowel habits.  Endocrine: Negative for cold intolerance, heat intolerance, polydipsia, polyphagia and polyuria.  Genitourinary:  Positive for menstrual problem. Negative for decreased urine volume, dysuria, hematuria, vaginal bleeding and vaginal discharge.  Musculoskeletal:  Negative for gait problem and myalgias.  Skin:  Negative for color change and rash.  Allergic/Immunologic: Positive for environmental allergies.  Neurological:  Negative for seizures, syncope, weakness and headaches.  Hematological:  Negative for adenopathy. Does not bruise/bleed easily.  Psychiatric/Behavioral:  Negative for confusion and hallucinations.   All other systems reviewed and are negative.  Current Outpatient Medications on File Prior to Visit  Medication Sig Dispense Refill   Azelaic Acid (FINACEA) 15 % gel Apply topically daily. After skin is thoroughly washed and patted dry, gently but thoroughly massage a thin film of azelaic acid cream into the affected area twice daily, in the morning and  evening.     fluticasone (FLONASE) 50 MCG/ACT nasal spray USE 1 SPRAY IN EACH NOSTRIL 2 TIMES DAILY AS NEEDED 48 mL 2   Multiple  Vitamin (DAILY VITAMIN) tablet Take 1 tablet by mouth.     No current facility-administered medications on file prior to visit.    Past Medical History:  Diagnosis Date   Allergy    Arthritis    Chicken pox    History of frequent urinary tract infections    Osteopenia    PONV (postoperative nausea and vomiting)    Past Surgical History:  Procedure Laterality Date   BREAST BIOPSY Left 2008   U/S Core- Benign   BREAST CYST ASPIRATION Left    BREAST CYST ASPIRATION Right    BREAST SURGERY     COLONOSCOPY     30 + yrs in [place] Right 2014   ROBOT ASSISTED MYOMECTOMY  2011   TONSILLECTOMY      Allergies  Allergen Reactions   Epinephrine     Lightheaded, things spin and "start to go dark", rapid HR per patient.   Latex Rash   Family History  Problem Relation Age of Onset   Arthritis Mother    Hyperlipidemia Mother    Heart disease Mother    Stroke Mother    Hypertension Mother    Diabetes Mother    Hyperlipidemia Father    Heart disease Father    Stroke Father    Hypertension Father    Breast cancer Maternal Aunt    Breast cancer Paternal Aunt    Colon polyps Neg Hx    Colon cancer Neg Hx    Esophageal cancer Neg Hx    Rectal cancer Neg Hx    Stomach cancer Neg Hx    Social History   Socioeconomic History   Marital status: Legally Separated    Spouse name: Not on file   Number of children: Not on file   Years of education: Not on file   Highest education level: Master's degree (e.g., MA, MS, MEng, MEd, MSW, MBA)  Occupational History   Not on file  Tobacco Use   Smoking status: Former   Smokeless tobacco: Never  Substance and Sexual Activity   Alcohol use: Yes    Comment: social   Drug use: No   Sexual activity: Not Currently    Birth control/protection: None  Other Topics Concern   Not on file  Social History Narrative   Not on file   Social Determinants of Health   Financial Resource Strain: Low Risk  (08/07/2021)   Overall  Financial Resource Strain (CARDIA)    Difficulty of Paying Living Expenses: Not hard at all  Food Insecurity: No Food Insecurity (08/07/2021)   Hunger Vital Sign    Worried About Running Out of Food in the Last Year: Never true    Ran Out of Food in the Last Year: Never true  Transportation Needs: No Transportation Needs (08/07/2021)   PRAPARE - Hydrologist (Medical): No    Lack of Transportation (Non-Medical): No  Physical Activity: Sufficiently Active (08/07/2021)   Exercise Vital Sign    Days of Exercise per Week: 5 days    Minutes of Exercise per Session: 40 min  Stress: Stress Concern Present (08/07/2021)   [place]    Feeling of Stress : To some extent  Social Connections: Socially Isolated (08/07/2021)   Social Connection  and Isolation Panel [NHANES]    Frequency of Communication with Friends and Family: Once a week    Frequency of Social Gatherings with Friends and Family: Once a week    Attends Religious Services: Never    Database administrator or Organizations: No    Attends Banker Meetings: Not on file    Marital Status: Separated    Vitals:   01/25/22 0757  BP: 118/70  Pulse: 73  Temp: 98.3 F (36.8 C)  SpO2: 99%   Body mass index is 27.94 kg/m.  Wt Readings from Last 3 Encounters:  01/25/22 178 lb 6 oz (80.9 kg)  08/10/21 183 lb 6.4 oz (83.2 kg)  12/29/20 180 lb 3.2 oz (81.7 kg)  Physical Exam Vitals and nursing note reviewed.  Constitutional:      General: She is not in acute distress.    Appearance: She is well-developed.  HENT:     Head: Normocephalic and atraumatic.     Right Ear: Hearing, tympanic membrane, ear canal and external ear normal.     Left Ear: Hearing, tympanic membrane, ear canal and external ear normal.     Mouth/Throat:     Mouth: Mucous membranes are moist.     Pharynx: Oropharynx is clear. Uvula midline.  Eyes:      Extraocular Movements: Extraocular movements intact.     Conjunctiva/sclera: Conjunctivae normal.     Pupils: Pupils are equal, round, and reactive to light.  Neck:     Thyroid: Thyromegaly (? right thyroid nodule.) present.     Trachea: No tracheal deviation.  Cardiovascular:     Rate and Rhythm: Normal rate and regular rhythm.     Pulses:          Dorsalis pedis pulses are 2+ on the right side and 2+ on the left side.     Heart sounds: No murmur heard. Pulmonary:     Effort: Pulmonary effort is normal. No respiratory distress.     Breath sounds: Normal breath sounds.  Abdominal:     Palpations: Abdomen is soft. There is no hepatomegaly or mass.     Tenderness: There is no abdominal tenderness.  Genitourinary:    Comments: Deferred to gyn. Musculoskeletal:     Comments: No major deformity or signs of synovitis appreciated.  Lymphadenopathy:     Cervical: No cervical adenopathy.     Upper Body:     Right upper body: No supraclavicular adenopathy.     Left upper body: No supraclavicular adenopathy.  Skin:    General: Skin is warm.     Findings: No erythema or rash.  Neurological:     General: No focal deficit present.     Mental Status: She is alert and oriented to person, place, and time.     Cranial Nerves: No cranial nerve deficit.     Coordination: Coordination normal.     Gait: Gait normal.     Deep Tendon Reflexes:     Reflex Scores:      Bicep reflexes are 2+ on the right side and 2+ on the left side.      Patellar reflexes are 2+ on the right side and 2+ on the left side. Psychiatric:     Comments: Well groomed, good eye contact.    ASSESSMENT AND PLAN: Ms. Karen Short was here today annual physical examination.  No orders of the defined types were placed in this encounter.  The 10-year ASCVD risk score (Arnett DK, et  al., 2019) is: 1%   Values used to calculate the score:     Age: 72 years     Sex: Female     Is Non-Hispanic African American: No      Diabetic: No     Tobacco smoker: No     Systolic Blood Pressure: 123456 mmHg     Is BP treated: No     HDL Cholesterol: 76.4 mg/dL     Total Cholesterol: 157 mg/dL  There are no diagnoses linked to this encounter.  There are no diagnoses linked to this encounter.  No follow-ups on file.  Karen Mabile G. [place], MD  [facility]. [place] office.

## 2022-01-25 ENCOUNTER — Other Ambulatory Visit: Payer: Self-pay | Admitting: Family Medicine

## 2022-01-25 ENCOUNTER — Ambulatory Visit (INDEPENDENT_AMBULATORY_CARE_PROVIDER_SITE_OTHER): Payer: BC Managed Care – PPO | Admitting: Family Medicine

## 2022-01-25 ENCOUNTER — Encounter: Payer: Self-pay | Admitting: Family Medicine

## 2022-01-25 VITALS — BP 118/70 | HR 73 | Temp 98.3°F | Resp 12 | Ht 67.0 in | Wt 178.4 lb

## 2022-01-25 DIAGNOSIS — Z13220 Encounter for screening for lipoid disorders: Secondary | ICD-10-CM

## 2022-01-25 DIAGNOSIS — Z1329 Encounter for screening for other suspected endocrine disorder: Secondary | ICD-10-CM

## 2022-01-25 DIAGNOSIS — Z130 Encounter for screening for diseases of the blood and blood-forming organs and certain disorders involving the immune mechanism: Secondary | ICD-10-CM

## 2022-01-25 DIAGNOSIS — Z13228 Encounter for screening for other metabolic disorders: Secondary | ICD-10-CM | POA: Diagnosis not present

## 2022-01-25 DIAGNOSIS — E049 Nontoxic goiter, unspecified: Secondary | ICD-10-CM

## 2022-01-25 DIAGNOSIS — M9904 Segmental and somatic dysfunction of sacral region: Secondary | ICD-10-CM | POA: Diagnosis not present

## 2022-01-25 DIAGNOSIS — M9903 Segmental and somatic dysfunction of lumbar region: Secondary | ICD-10-CM | POA: Diagnosis not present

## 2022-01-25 DIAGNOSIS — M9902 Segmental and somatic dysfunction of thoracic region: Secondary | ICD-10-CM | POA: Diagnosis not present

## 2022-01-25 DIAGNOSIS — E559 Vitamin D deficiency, unspecified: Secondary | ICD-10-CM

## 2022-01-25 DIAGNOSIS — Z23 Encounter for immunization: Secondary | ICD-10-CM

## 2022-01-25 DIAGNOSIS — M9901 Segmental and somatic dysfunction of cervical region: Secondary | ICD-10-CM | POA: Diagnosis not present

## 2022-01-25 DIAGNOSIS — Z0000 Encounter for general adult medical examination without abnormal findings: Secondary | ICD-10-CM | POA: Diagnosis not present

## 2022-01-25 DIAGNOSIS — F419 Anxiety disorder, unspecified: Secondary | ICD-10-CM

## 2022-01-25 LAB — BASIC METABOLIC PANEL
BUN: 11 mg/dL (ref 6–23)
CO2: 30 mEq/L (ref 19–32)
Calcium: 9.6 mg/dL (ref 8.4–10.5)
Chloride: 102 mEq/L (ref 96–112)
Creatinine, Ser: 0.78 mg/dL (ref 0.40–1.20)
GFR: 85.36 mL/min (ref >60.00)
Glucose, Bld: 92 mg/dL (ref 70–99)
Potassium: 4.4 mEq/L (ref 3.5–5.1)
Sodium: 137 mEq/L (ref 135–145)

## 2022-01-25 LAB — VITAMIN D 25 HYDROXY (VIT D DEFICIENCY, FRACTURES): VITD: 88.82 ng/mL (ref 30.00–100.00)

## 2022-01-25 LAB — LIPID PANEL
Cholesterol: 184 mg/dL (ref 0–200)
HDL: 77.3 mg/dL (ref >39.00)
LDL Cholesterol: 94 mg/dL (ref 0–99)
NonHDL: 106.71
Total CHOL/HDL Ratio: 2
Triglycerides: 64 mg/dL (ref 0.0–149.0)
VLDL: 12.8 mg/dL (ref 0.0–40.0)

## 2022-01-25 LAB — TSH: TSH: 3.62 u[IU]/mL (ref 0.35–5.50)

## 2022-01-25 NOTE — Assessment & Plan Note (Signed)
Continue vit D supplementation. Further recommendations according to 25 OH vit D result.

## 2022-01-25 NOTE — Patient Instructions (Addendum)
A few things to remember from today's visit:  Routine general medical examination at a health care facility  Vitamin D deficiency, unspecified - Plan: VITAMIN D 25 Hydroxy (Vit-D Deficiency, Fractures)  Enlarged thyroid gland - Plan: TSH, US THYROID  Screening for lipoid disorders - Plan: Lipid panel  Screening for endocrine, metabolic and immunity disorder - Plan: Basic metabolic panel  If you need refills for medications you take chronically, please call your pharmacy. Do not use My Chart to request refills or for acute issues that need immediate attention. If you send a my chart message, it may take a few days to be addressed, specially if I am not in the office.  Please be sure medication list is accurate. If a new problem present, please set up appointment sooner than planned today.  Health Maintenance, Female Adopting a healthy lifestyle and getting preventive care are important in promoting health and wellness. Ask your health care provider about: The right schedule for you to have regular tests and exams. Things you can do on your own to prevent diseases and keep yourself healthy. What should I know about diet, weight, and exercise? Eat a healthy diet  Eat a diet that includes plenty of vegetables, fruits, low-fat dairy products, and lean protein. Do not eat a lot of foods that are high in solid fats, added sugars, or sodium. Maintain a healthy weight Body mass index (BMI) is used to identify weight problems. It estimates body fat based on height and weight. Your health care provider can help determine your BMI and help you achieve or maintain a healthy weight. Get regular exercise Get regular exercise. This is one of the most important things you can do for your health. Most adults should: Exercise for at least 150 minutes each week. The exercise should increase your heart rate and make you sweat (moderate-intensity exercise). Do strengthening exercises at least twice a  week. This is in addition to the moderate-intensity exercise. Spend less time sitting. Even light physical activity can be beneficial. Watch cholesterol and blood lipids Have your blood tested for lipids and cholesterol at 55 years of age, then have this test every 5 years. Have your cholesterol levels checked more often if: Your lipid or cholesterol levels are high. You are older than 54 years of age. You are at high risk for heart disease. What should I know about cancer screening? Depending on your health history and family history, you may need to have cancer screening at various ages. This may include screening for: Breast cancer. Cervical cancer. Colorectal cancer. Skin cancer. Lung cancer. What should I know about heart disease, diabetes, and high blood pressure? Blood pressure and heart disease High blood pressure causes heart disease and increases the risk of stroke. This is more likely to develop in people who have high blood pressure readings or are overweight. Have your blood pressure checked: Every 3-5 years if you are 63-40 years of age. Every year if you are 58 years old or older. Diabetes Have regular diabetes screenings. This checks your fasting blood sugar level. Have the screening done: Once every three years after age 72 if you are at a normal weight and have a low risk for diabetes. More often and at a younger age if you are overweight or have a high risk for diabetes. What should I know about preventing infection? Hepatitis B If you have a higher risk for hepatitis B, you should be screened for this virus. Talk with your health care provider  to find out if you are at risk for hepatitis B infection. Hepatitis C Testing is recommended for: Everyone born from [birth year] through 1964. Anyone with known risk factors for hepatitis C. Sexually transmitted infections (STIs) Get screened for STIs, including gonorrhea and chlamydia, if: You are sexually active and are younger  than 55 years of age. You are older than 55 years of age and your health care provider tells you that you are at risk for this type of infection. Your sexual activity has changed since you were last screened, and you are at increased risk for chlamydia or gonorrhea. Ask your health care provider if you are at risk. Ask your health care provider about whether you are at high risk for HIV. Your health care provider may recommend a prescription medicine to help prevent HIV infection. If you choose to take medicine to prevent HIV, you should first get tested for HIV. You should then be tested every 3 months for as long as you are taking the medicine. Pregnancy If you are about to stop having your period (premenopausal) and you may become pregnant, seek counseling before you get pregnant. Take 400 to 800 micrograms (mcg) of folic acid every day if you become pregnant. Ask for birth control (contraception) if you want to prevent pregnancy. Osteoporosis and menopause Osteoporosis is a disease in which the bones lose minerals and strength with aging. This can result in bone fractures. If you are 76 years old or older, or if you are at risk for osteoporosis and fractures, ask your health care provider if you should: Be screened for bone loss. Take a calcium or vitamin D supplement to lower your risk of fractures. Be given hormone replacement therapy (HRT) to treat symptoms of menopause. Follow these instructions at home: Alcohol use Do not drink alcohol if: Your health care provider tells you not to drink. You are pregnant, may be pregnant, or are planning to become pregnant. If you drink alcohol: Limit how much you have to: 0-1 drink a day. Know how much alcohol is in your drink. In the [place] one drink equals one 12 oz bottle of beer (355 mL), one 5 oz glass of wine (148 mL), or one 1 oz glass of hard liquor (44 mL). Lifestyle Do not use any products that contain nicotine or tobacco. These products  include cigarettes, chewing tobacco, and vaping devices, such as e-cigarettes. If you need help quitting, ask your health care provider. Do not use street drugs. Do not share needles. Ask your health care provider for help if you need support or information about quitting drugs. General instructions Schedule regular health, dental, and eye exams. Stay current with your vaccines. Tell your health care provider if: You often feel depressed. You have ever been abused or do not feel safe at home. Summary Adopting a healthy lifestyle and getting preventive care are important in promoting health and wellness. Follow your health care provider's instructions about healthy diet, exercising, and getting tested or screened for diseases. Follow your health care provider's instructions on monitoring your cholesterol and blood pressure. This information is not intended to replace advice given to you by your health care provider. Make sure you discuss any questions you have with your health care provider. Document Revised: 06/26/2020 Document Reviewed: 06/26/2020 Elsevier Patient Education  2023 [employer].

## 2022-01-25 NOTE — Assessment & Plan Note (Signed)
Stable. Continue sertraline 25mg daily

## 2022-01-25 NOTE — Assessment & Plan Note (Signed)
We discussed the importance of regular physical activity and healthy diet for prevention of chronic illness and/or complications. Preventive guidelines reviewed. Vaccination updated. Female preventive care with gyn. Ca++ and vit D supplementation to continue. Next CPE in a year.

## 2022-02-05 DIAGNOSIS — M25551 Pain in right hip: Secondary | ICD-10-CM | POA: Diagnosis not present

## 2022-02-05 DIAGNOSIS — Z309 Encounter for contraceptive management, unspecified: Secondary | ICD-10-CM | POA: Diagnosis not present

## 2022-02-05 DIAGNOSIS — M542 Cervicalgia: Secondary | ICD-10-CM | POA: Diagnosis not present

## 2022-02-06 ENCOUNTER — Ambulatory Visit
Admission: RE | Admit: 2022-02-06 | Discharge: 2022-02-06 | Disposition: A | Discharge status: Home / Self Care | Payer: BC Managed Care – PPO | Source: Ambulatory Visit | Attending: Family Medicine | Admitting: Family Medicine

## 2022-02-06 DIAGNOSIS — E049 Nontoxic goiter, unspecified: Secondary | ICD-10-CM

## 2022-02-06 DIAGNOSIS — E041 Nontoxic single thyroid nodule: Secondary | ICD-10-CM | POA: Diagnosis not present

## 2022-02-08 DIAGNOSIS — M9903 Segmental and somatic dysfunction of lumbar region: Secondary | ICD-10-CM | POA: Diagnosis not present

## 2022-02-08 DIAGNOSIS — M9902 Segmental and somatic dysfunction of thoracic region: Secondary | ICD-10-CM | POA: Diagnosis not present

## 2022-02-08 DIAGNOSIS — M9904 Segmental and somatic dysfunction of sacral region: Secondary | ICD-10-CM | POA: Diagnosis not present

## 2022-02-08 DIAGNOSIS — M9901 Segmental and somatic dysfunction of cervical region: Secondary | ICD-10-CM | POA: Diagnosis not present

## 2022-03-01 ENCOUNTER — Encounter: Payer: Self-pay | Admitting: Family Medicine

## 2022-03-01 DIAGNOSIS — M9904 Segmental and somatic dysfunction of sacral region: Secondary | ICD-10-CM | POA: Diagnosis not present

## 2022-03-01 DIAGNOSIS — M9903 Segmental and somatic dysfunction of lumbar region: Secondary | ICD-10-CM | POA: Diagnosis not present

## 2022-03-01 DIAGNOSIS — M25551 Pain in right hip: Secondary | ICD-10-CM | POA: Diagnosis not present

## 2022-03-01 DIAGNOSIS — M9902 Segmental and somatic dysfunction of thoracic region: Secondary | ICD-10-CM | POA: Diagnosis not present

## 2022-03-01 DIAGNOSIS — M542 Cervicalgia: Secondary | ICD-10-CM | POA: Diagnosis not present

## 2022-03-01 DIAGNOSIS — M9901 Segmental and somatic dysfunction of cervical region: Secondary | ICD-10-CM | POA: Diagnosis not present

## 2022-03-05 DIAGNOSIS — M9901 Segmental and somatic dysfunction of cervical region: Secondary | ICD-10-CM | POA: Diagnosis not present

## 2022-03-05 DIAGNOSIS — M9902 Segmental and somatic dysfunction of thoracic region: Secondary | ICD-10-CM | POA: Diagnosis not present

## 2022-03-05 DIAGNOSIS — M9903 Segmental and somatic dysfunction of lumbar region: Secondary | ICD-10-CM | POA: Diagnosis not present

## 2022-03-05 DIAGNOSIS — M9904 Segmental and somatic dysfunction of sacral region: Secondary | ICD-10-CM | POA: Diagnosis not present

## 2022-03-05 IMAGING — MR MR BREAST BILAT WO/W CM
8 of 12 series · 32 of 48 positions shown · IV contrast (gadavist)
Comparison: No prior MRI.

CLINICAL DATA: 54-year-old a family history of breast cancer in her
maternal aunt. Personal history of benign excisional biopsy of the
LEFT breast in 1661. Mammographically dense breasts. High risk
screening.

LABS:  Not applicable.
EXAM:
BILATERAL BREAST MRI WITH AND WITHOUT CONTRAST
TECHNIQUE: Multiplanar, multisequence MR images of both breasts were obtained
prior to and following the intravenous administration of 8 ml of
Gadavist.

[Series 2: t2_tirm_tra ipat (a-p) · axial · 3.0mm · 0.70mm/px · 1 of 64 slices shown]
[im 1/64]
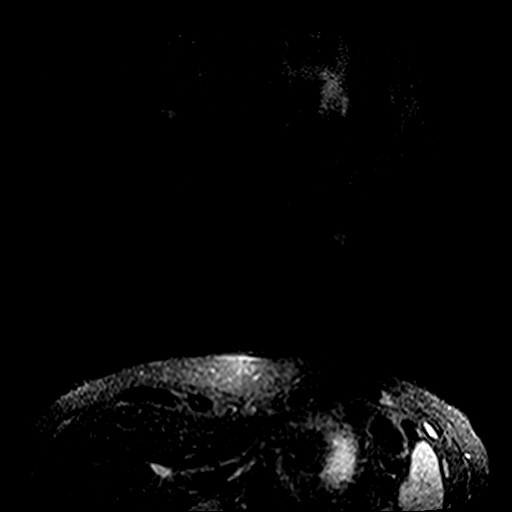

[Series 3: fl3d pre-cm no · axial · non-contrast · 1.2mm · 0.94mm/px · z∈[-127,+64]mm · 5 of 160 slices shown]
[im 1/160]
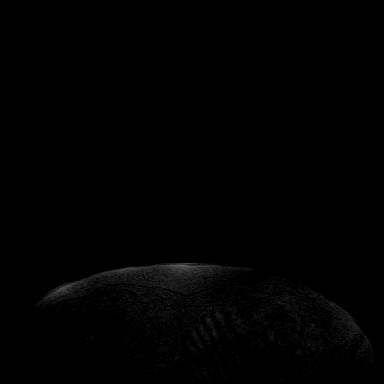
[im 40/160]
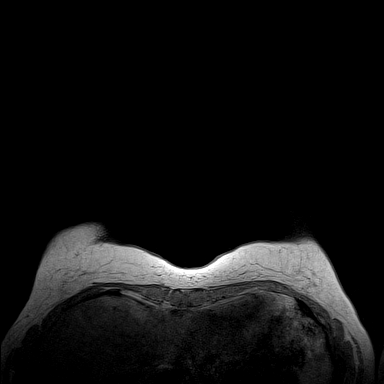
[im 80/160]
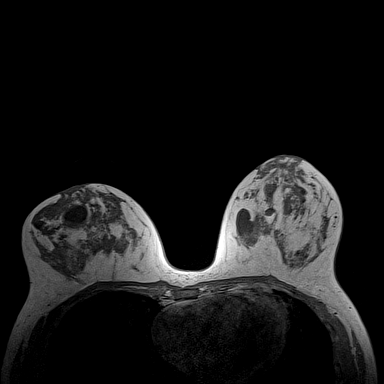
[im 120/160]
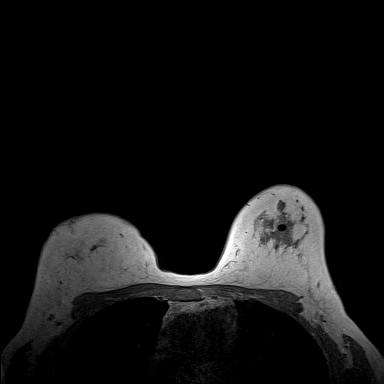
[im 160/160]
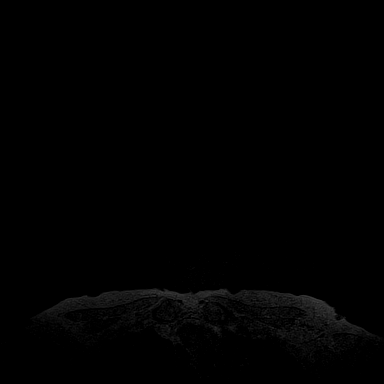

[Series 4: fl3d pre-cm · axial · non-contrast · 1.2mm · 0.94mm/px · z∈[-127,+64]mm · 5 of 160 slices shown]
[im 1/160]
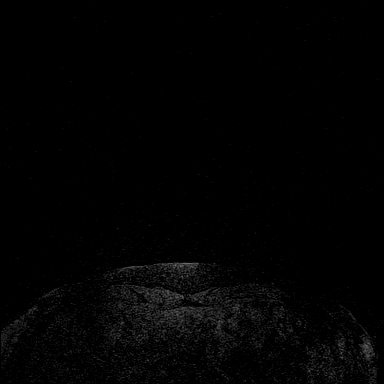
[im 40/160]
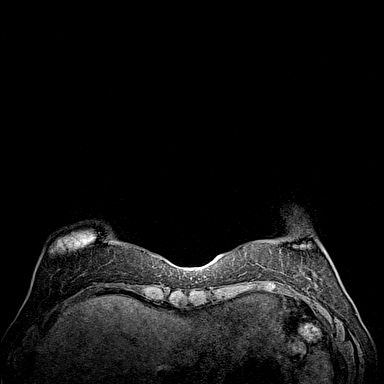
[im 80/160]
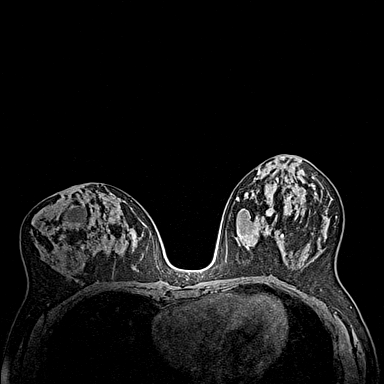
[im 120/160]
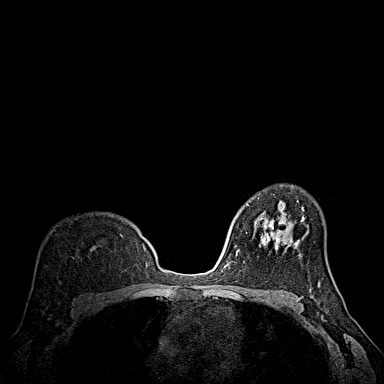
[im 160/160]
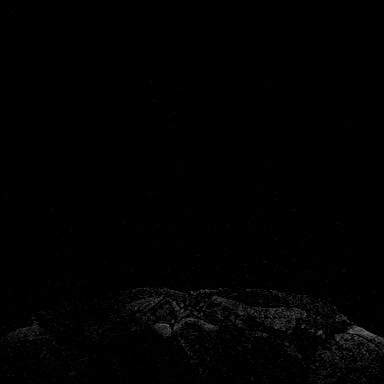

[Series 5: fl3d post-cm 20 · axial · 1.2mm · 0.94mm/px · z∈[-127,+64]mm · 5 of 160 slices shown (1 of 3)]
[im 1/160]
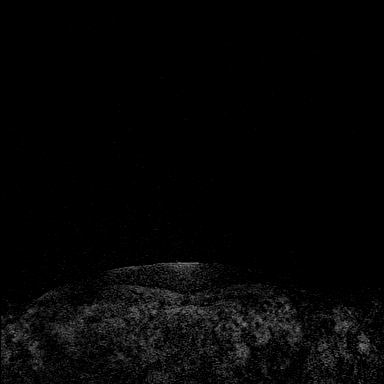
[im 40/160]
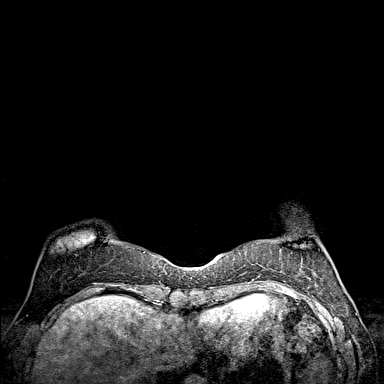
[im 80/160]
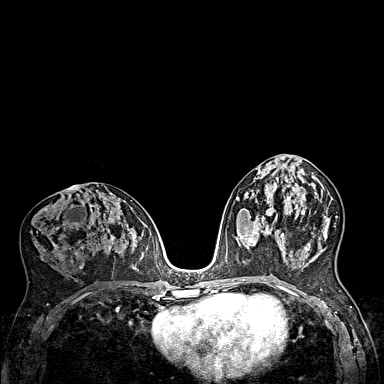
[im 120/160]
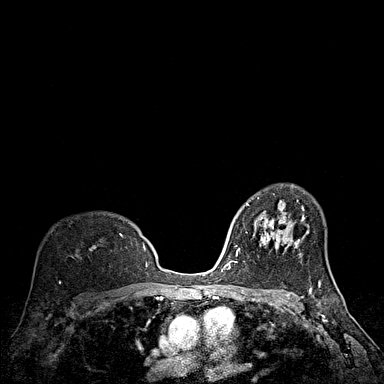
[im 160/160]
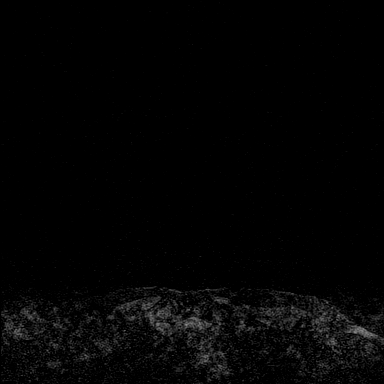

[Series 6: fl3d post-cm 20 · axial · 1.2mm · 0.94mm/px · z∈[-127,+64]mm · 5 of 160 slices shown (2 of 3)]
[im 1/160]
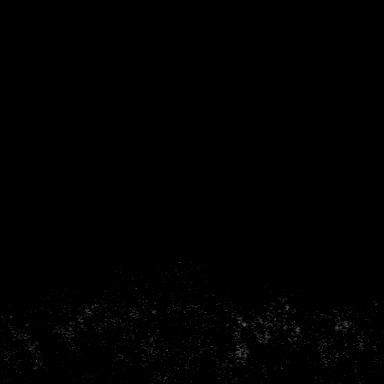
[im 40/160]
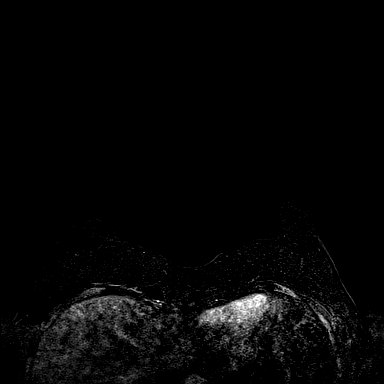
[im 80/160]
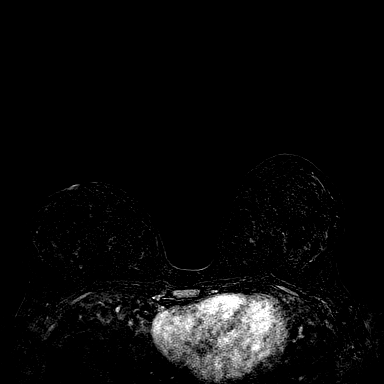
[im 120/160]
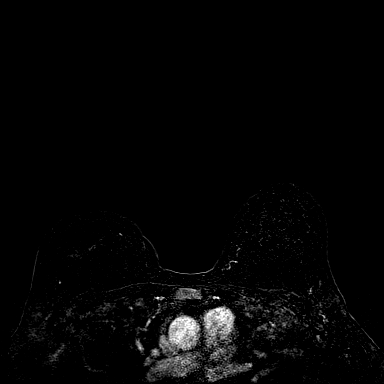
[im 160/160]
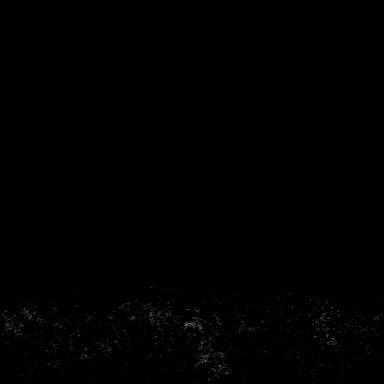

[Series 7: fl3d post-cm 20 · axial · 192.0mm · 0.94mm/px · 1 of 1 slices shown (3 of 3)]
[im 1/1]
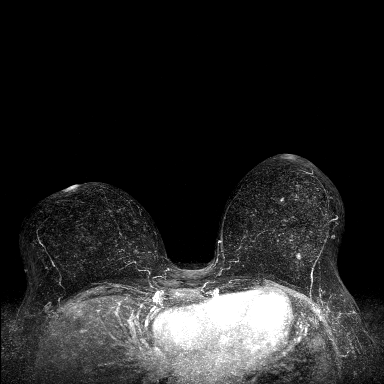

[Series 8: fl3d post-cm 3min · axial · 1.2mm · 0.94mm/px · z∈[-127,+64]mm · 6 of 160 slices shown]
[im 1/160]
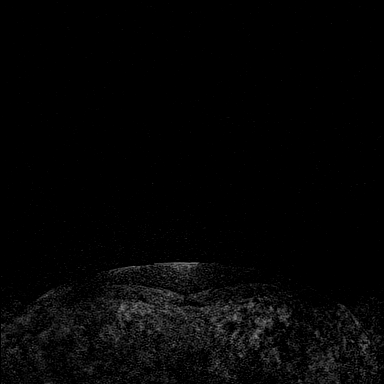
[im 32/160]
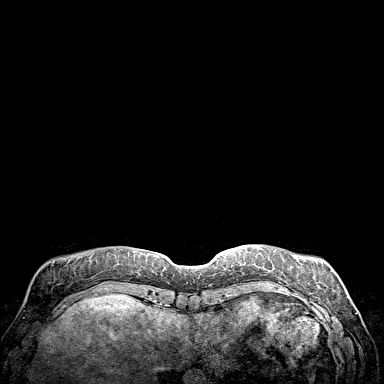
[im 64/160]
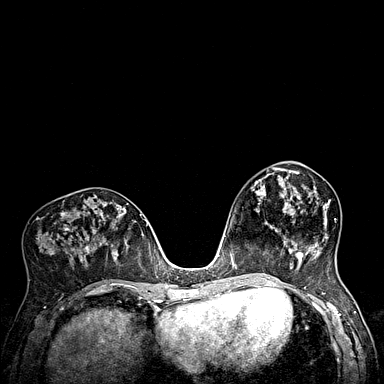
[im 96/160]
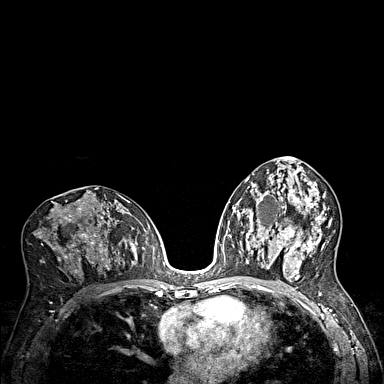
[im 128/160]
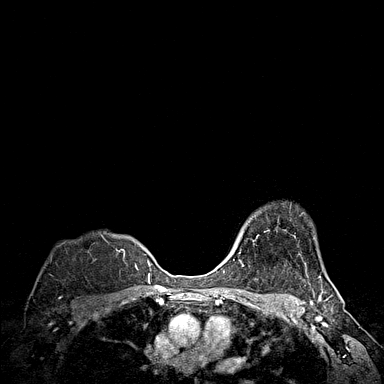
[im 160/160]
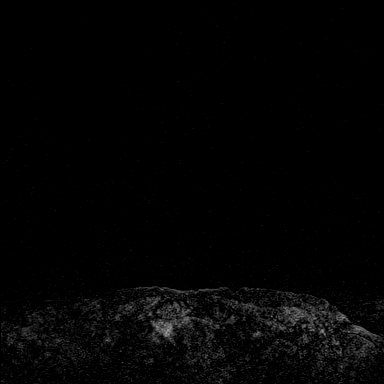

[Series 9: fl3d post-cm 3min_sub · axial · 1.2mm · 0.94mm/px · z∈[-127,-13]mm · 4 of 160 slices shown]
[im 1/160]
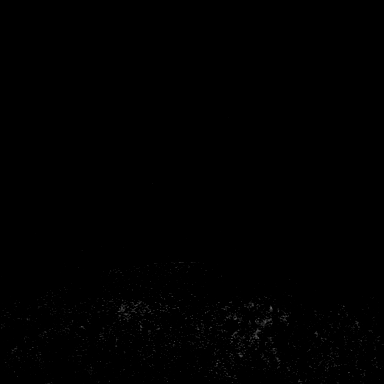
[im 32/160]
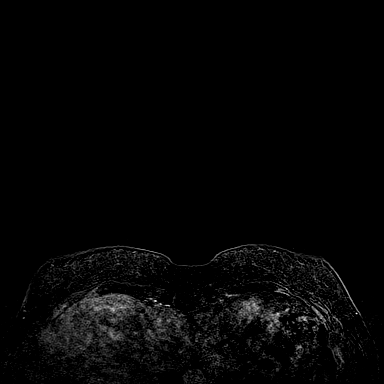
[im 64/160]
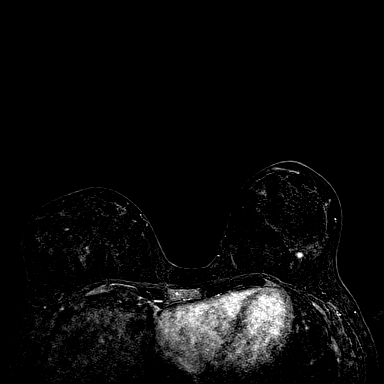
[im 96/160]
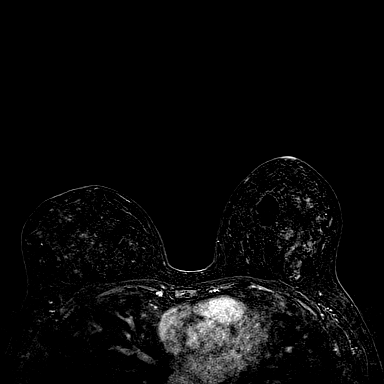

[32 of 48 positions shown; findings below may reference images not displayed]

Three-dimensional MR images were rendered by post-processing of the
original MR data on an independent workstation. The
three-dimensional MR images were interpreted, and findings are
reported in the following complete MRI report for this study. Three
dimensional images were evaluated at the independent interpreting
workstation using the DynaCAD thin client.
Multiple prior mammograms, most recently
09/08/2019. Prior breast ultrasounds, most recently 07/19/2016.
FINDINGS: Breast composition: d. Extreme fibroglandular tissue.

Background parenchymal enhancement: Moderate.

Right breast: No suspicious mass or abnormal enhancement. Multiple
T1 hypointense and T2 hyperintense benign cysts, the largest
measuring approximately 3 cm in the retroareolar location.

Left breast: Enhancing circumscribed 7 mm mass involving the LOWER
OUTER QUADRANT at POSTERIOR depth demonstrating progressive
kinetics.

Multiple T1 hypointense and T2 hyperintense benign cysts, the
largest measuring approximately 3 cm in the UPPER INNER QUADRANT.

Lymph nodes: No pathologic lymphadenopathy.

Ancillary findings: Benign cysts in the RIGHT lobe of the liver, the
largest measuring approximately 1.1 cm at the dome.
IMPRESSION: 1. Indeterminate though likely benign circumscribed 7 mm mass
involving the LOWER OUTER QUADRANT of the LEFT breast with
progressive kinetics.
2. No MRI evidence of malignancy involving the RIGHT breast.
3. Multiple benign cysts throughout both breasts.
4. Benign liver cysts.
5. No pathologic lymphadenopathy.

RECOMMENDATION:
1. Second-look ultrasound of the LOWER OUTER QUADRANT of the LEFT
breast.
2. If the enhancing solid mass is not identified at ultrasound, then
MRI guided biopsy of the mass would be recommended to confirm
benignity.

BI-RADS CATEGORY  3: Probably benign.

## 2022-03-08 DIAGNOSIS — M9902 Segmental and somatic dysfunction of thoracic region: Secondary | ICD-10-CM | POA: Diagnosis not present

## 2022-03-08 DIAGNOSIS — M9903 Segmental and somatic dysfunction of lumbar region: Secondary | ICD-10-CM | POA: Diagnosis not present

## 2022-03-08 DIAGNOSIS — M9904 Segmental and somatic dysfunction of sacral region: Secondary | ICD-10-CM | POA: Diagnosis not present

## 2022-03-08 DIAGNOSIS — M9901 Segmental and somatic dysfunction of cervical region: Secondary | ICD-10-CM | POA: Diagnosis not present

## 2022-03-15 DIAGNOSIS — M9904 Segmental and somatic dysfunction of sacral region: Secondary | ICD-10-CM | POA: Diagnosis not present

## 2022-03-15 DIAGNOSIS — M9903 Segmental and somatic dysfunction of lumbar region: Secondary | ICD-10-CM | POA: Diagnosis not present

## 2022-03-15 DIAGNOSIS — M9902 Segmental and somatic dysfunction of thoracic region: Secondary | ICD-10-CM | POA: Diagnosis not present

## 2022-03-15 DIAGNOSIS — M9901 Segmental and somatic dysfunction of cervical region: Secondary | ICD-10-CM | POA: Diagnosis not present

## 2022-03-17 ENCOUNTER — Other Ambulatory Visit: Payer: Self-pay | Admitting: Family Medicine

## 2022-03-17 DIAGNOSIS — J302 Other seasonal allergic rhinitis: Secondary | ICD-10-CM

## 2022-03-22 DIAGNOSIS — M9903 Segmental and somatic dysfunction of lumbar region: Secondary | ICD-10-CM | POA: Diagnosis not present

## 2022-03-22 DIAGNOSIS — M9901 Segmental and somatic dysfunction of cervical region: Secondary | ICD-10-CM | POA: Diagnosis not present

## 2022-03-22 DIAGNOSIS — M9902 Segmental and somatic dysfunction of thoracic region: Secondary | ICD-10-CM | POA: Diagnosis not present

## 2022-03-22 DIAGNOSIS — M9904 Segmental and somatic dysfunction of sacral region: Secondary | ICD-10-CM | POA: Diagnosis not present

## 2022-03-29 DIAGNOSIS — M9904 Segmental and somatic dysfunction of sacral region: Secondary | ICD-10-CM | POA: Diagnosis not present

## 2022-03-29 DIAGNOSIS — M9902 Segmental and somatic dysfunction of thoracic region: Secondary | ICD-10-CM | POA: Diagnosis not present

## 2022-03-29 DIAGNOSIS — M9903 Segmental and somatic dysfunction of lumbar region: Secondary | ICD-10-CM | POA: Diagnosis not present

## 2022-03-29 DIAGNOSIS — M9901 Segmental and somatic dysfunction of cervical region: Secondary | ICD-10-CM | POA: Diagnosis not present

## 2022-03-29 DIAGNOSIS — M25551 Pain in right hip: Secondary | ICD-10-CM | POA: Diagnosis not present

## 2022-03-29 DIAGNOSIS — M542 Cervicalgia: Secondary | ICD-10-CM | POA: Diagnosis not present

## 2022-04-13 IMAGING — MG MM BREAST LOCALIZATION CLIP
4 series · 4 of 12 positions shown · non-contrast
Comparison: Prior films

CLINICAL DATA: Status post MRI guided core biopsy of left breast
mass

EXAM:
DIAGNOSTIC left MAMMOGRAM POST MRI BIOPSY

[L CC synth-2D]
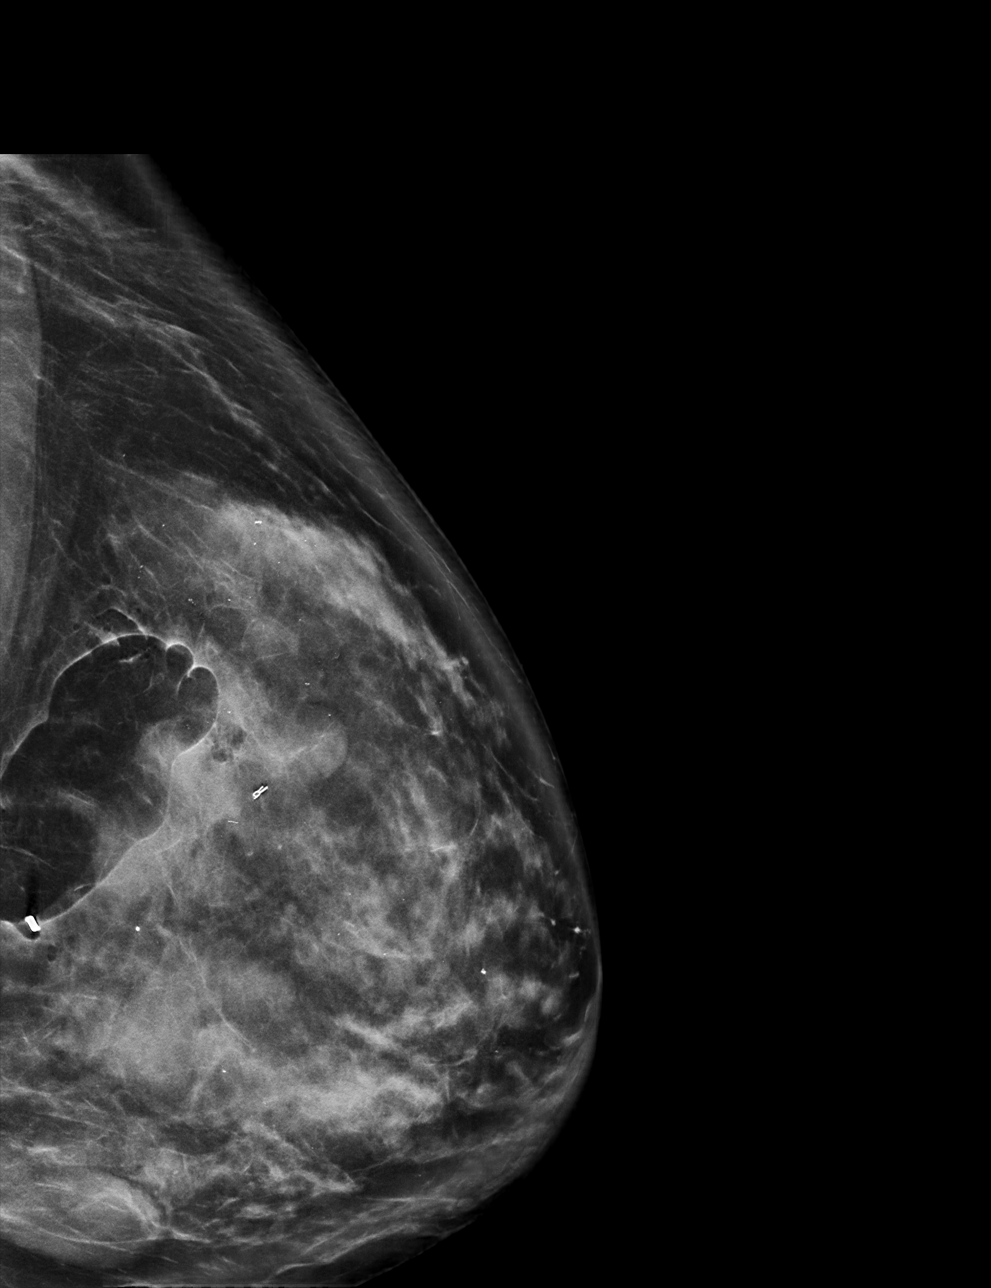

[L ML synth-2D]
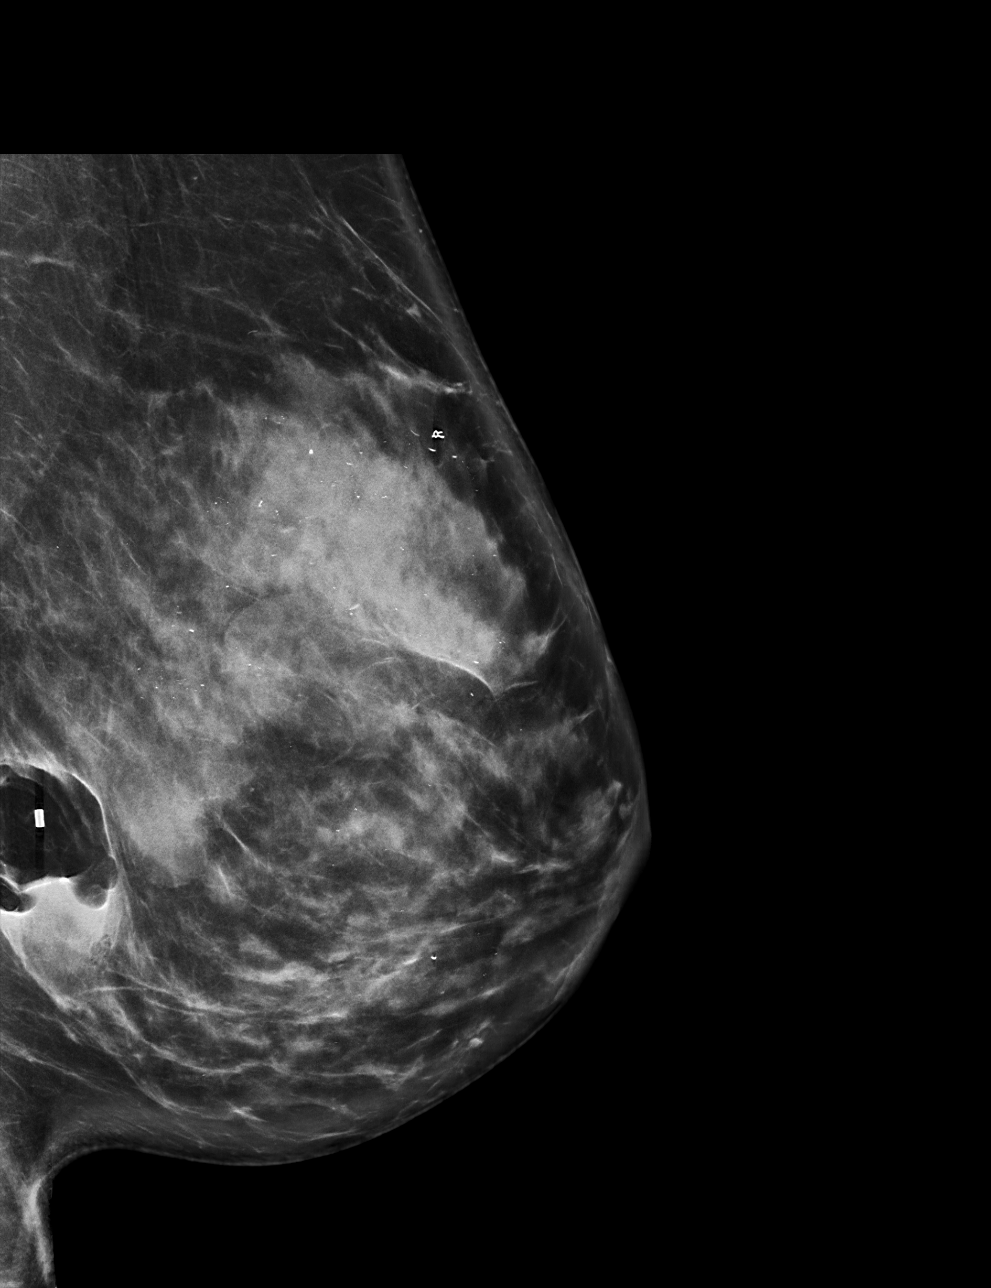

[L CC tomo · tomo slice 47/92.0]
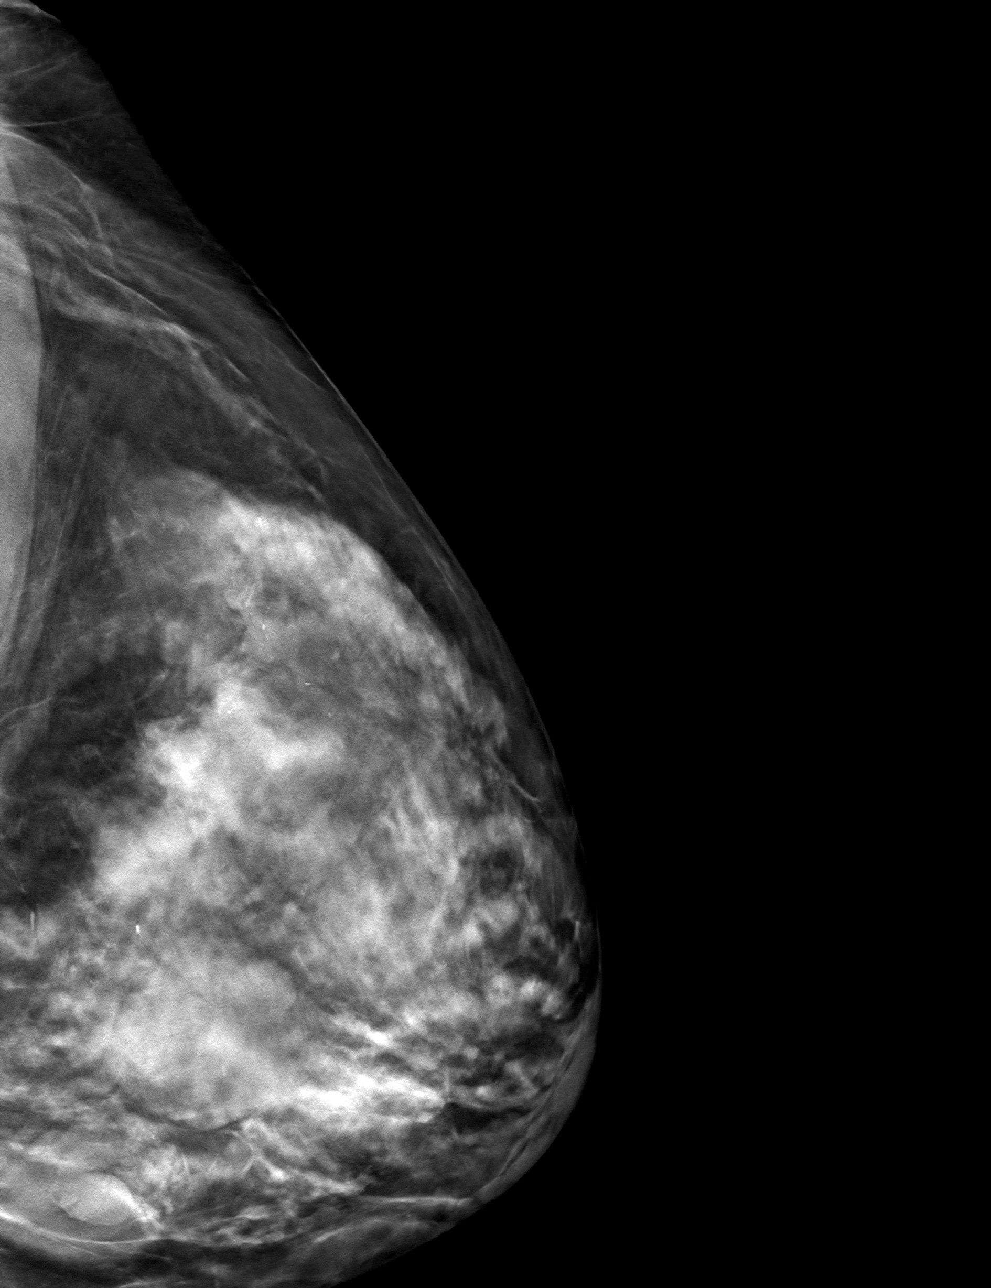

[L ML tomo · tomo slice 43/85.0]
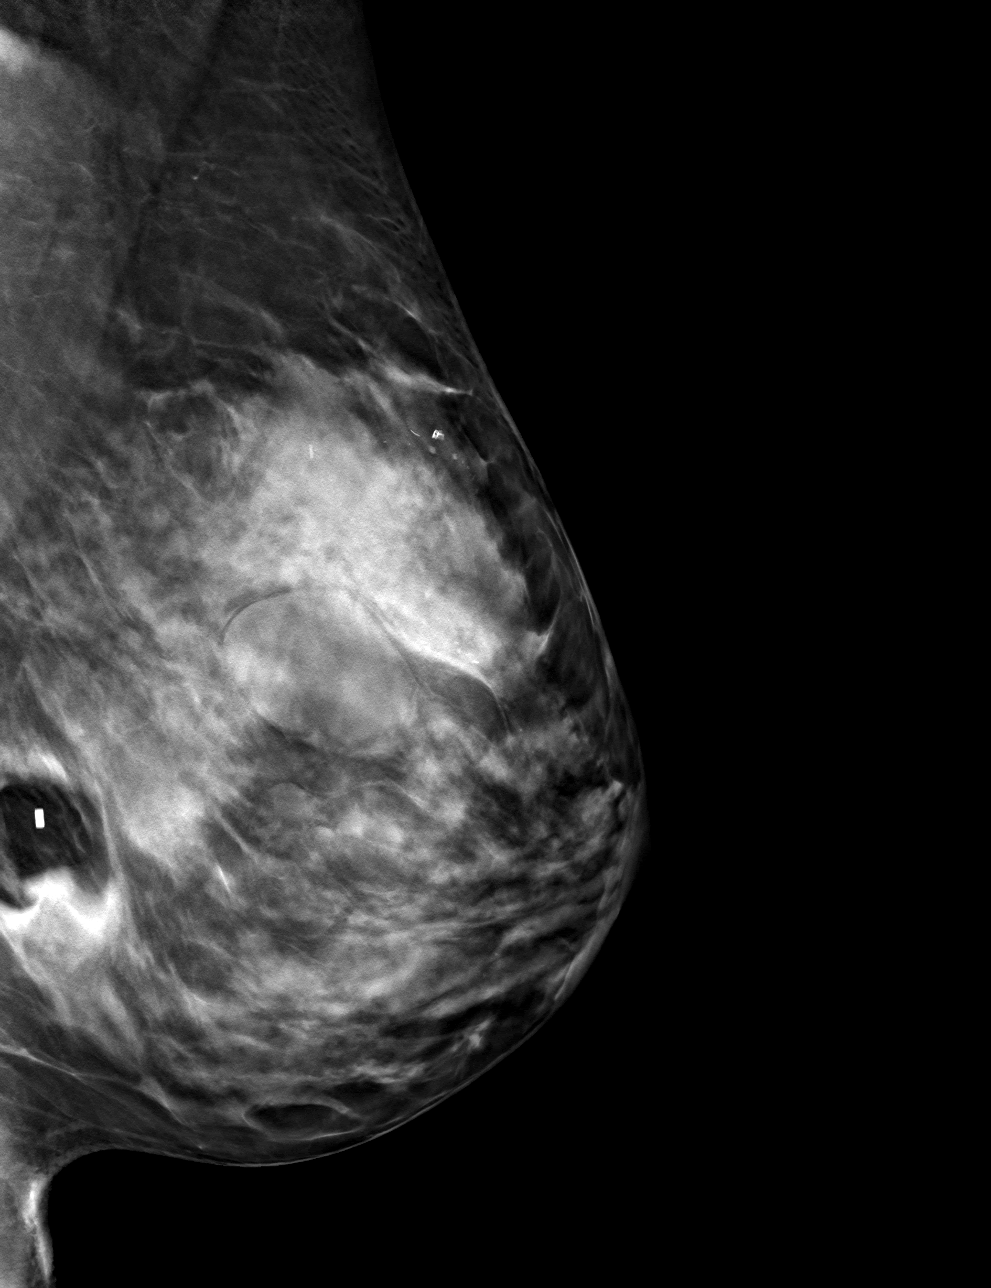

[4 of 12 positions shown; findings below may reference images not displayed]

FINDINGS: Mammographic images were obtained following MRI guided biopsy of
focal enhancement in the posterior lower outer quadrant left breast.
The biopsy marking clip is in expected position at the site of
biopsy.
IMPRESSION: Appropriate positioning of the cylinder shaped biopsy marking clip
at the site of biopsy in the expected location of concern.

Final Assessment: Post Procedure Mammograms for Marker Placement

## 2022-04-15 DIAGNOSIS — M5127 Other intervertebral disc displacement, lumbosacral region: Secondary | ICD-10-CM | POA: Diagnosis not present

## 2022-04-15 DIAGNOSIS — M9905 Segmental and somatic dysfunction of pelvic region: Secondary | ICD-10-CM | POA: Diagnosis not present

## 2022-04-15 DIAGNOSIS — M9901 Segmental and somatic dysfunction of cervical region: Secondary | ICD-10-CM | POA: Diagnosis not present

## 2022-04-15 DIAGNOSIS — M9904 Segmental and somatic dysfunction of sacral region: Secondary | ICD-10-CM | POA: Diagnosis not present

## 2022-04-19 DIAGNOSIS — M542 Cervicalgia: Secondary | ICD-10-CM | POA: Diagnosis not present

## 2022-04-19 DIAGNOSIS — M25551 Pain in right hip: Secondary | ICD-10-CM | POA: Diagnosis not present

## 2022-04-22 DIAGNOSIS — M5127 Other intervertebral disc displacement, lumbosacral region: Secondary | ICD-10-CM | POA: Diagnosis not present

## 2022-04-22 DIAGNOSIS — M9905 Segmental and somatic dysfunction of pelvic region: Secondary | ICD-10-CM | POA: Diagnosis not present

## 2022-04-22 DIAGNOSIS — M9904 Segmental and somatic dysfunction of sacral region: Secondary | ICD-10-CM | POA: Diagnosis not present

## 2022-04-22 DIAGNOSIS — M9901 Segmental and somatic dysfunction of cervical region: Secondary | ICD-10-CM | POA: Diagnosis not present

## 2022-04-23 ENCOUNTER — Ambulatory Visit (INDEPENDENT_AMBULATORY_CARE_PROVIDER_SITE_OTHER): Payer: BC Managed Care – PPO | Admitting: Family Medicine

## 2022-04-23 ENCOUNTER — Ambulatory Visit (INDEPENDENT_AMBULATORY_CARE_PROVIDER_SITE_OTHER): Payer: BC Managed Care – PPO

## 2022-04-23 VITALS — BP 110/72 | HR 77 | Ht 67.0 in | Wt 176.0 lb

## 2022-04-23 DIAGNOSIS — M25551 Pain in right hip: Secondary | ICD-10-CM

## 2022-04-23 NOTE — Progress Notes (Unsigned)
[place] Phone: [phone] Subjective:   Fontaine No, am serving as a scribe for Dr. Hulan Saas.  I'm seeing this patient by the request  of:  Martinique, Betty G, MD  CC: Right hip pain.  [id number]  Karen Short is a 57 y.o. female coming in with complaint of R hip pain. Patient has had tear since 2016. Had popping in her hip but no pain. Patient was sitting with R leg in FABER position and she developed sharp pain that radiated down the R leg. Sharp and hot pain occurs with lumbar flexion and IR/ER of her hip. Denies any tinging. Patient had meniscus repair in 2014. Leg was immobilized and she said that her leg fell off operating table and she figured she injured hip at that time. Patient seeing PT for upper back and she is working on HEP for her hip as well. Did not find that these were helpful. Also had dry needling in the hip.   MRI 2016 anterior superior labrum       Past Medical History:  Diagnosis Date   Allergy    Arthritis    Chicken pox    History of frequent urinary tract infections    Osteopenia    PONV (postoperative nausea and vomiting)    Past Surgical History:  Procedure Laterality Date   BREAST BIOPSY Left 2008   U/S Core- Benign   BREAST CYST ASPIRATION Left    BREAST CYST ASPIRATION Right    BREAST SURGERY     COLONOSCOPY     30 + yrs in [place] Right 2014   ROBOT ASSISTED MYOMECTOMY  2011   TONSILLECTOMY     Social History   Socioeconomic History   Marital status: Legally Separated    Spouse name: Not on file   Number of children: Not on file   Years of education: Not on file   Highest education level: Master's degree (e.g., MA, MS, MEng, MEd, MSW, MBA)  Occupational History   Not on file  Tobacco Use   Smoking status: Former   Smokeless tobacco: Never  Substance and Sexual Activity   Alcohol use: Yes    Comment: social   Drug use: No    Sexual activity: Not Currently    Birth control/protection: None  Other Topics Concern   Not on file  Social History Narrative   Not on file   Social Determinants of Health   Financial Resource Strain: Low Risk  (08/07/2021)   Overall Financial Resource Strain (CARDIA)    Difficulty of Paying Living Expenses: Not hard at all  Food Insecurity: No Food Insecurity (08/07/2021)   Hunger Vital Sign    Worried About Running Out of Food in the Last Year: Never true    Ran Out of Food in the Last Year: Never true  Transportation Needs: No Transportation Needs (08/07/2021)   PRAPARE - Hydrologist (Medical): No    Lack of Transportation (Non-Medical): No  Physical Activity: Sufficiently Active (08/07/2021)   Exercise Vital Sign    Days of Exercise per Week: 5 days    Minutes of Exercise per Session: 40 min  Stress: Stress Concern Present (08/07/2021)   [place]    Feeling of Stress : To some extent  Social Connections: Socially Isolated (08/07/2021)   Social Connection and Isolation Panel [NHANES]  Frequency of Communication with Friends and Family: Once a week    Frequency of Social Gatherings with Friends and Family: Once a week    Attends Religious Services: Never    Marine scientist or Organizations: No    Attends Music therapist: Not on file    Marital Status: Separated   Allergies  Allergen Reactions   Epinephrine     Lightheaded, things spin and "start to go dark", rapid HR per patient.   Latex Rash   Family History  Problem Relation Age of Onset   Arthritis Mother    Hyperlipidemia Mother    Heart disease Mother    Stroke Mother    Hypertension Mother    Diabetes Mother    Hyperlipidemia Father    Heart disease Father    Stroke Father    Hypertension Father    Breast cancer Maternal Aunt    Breast cancer Paternal Aunt    Colon polyps Neg Hx     Colon cancer Neg Hx    Esophageal cancer Neg Hx    Rectal cancer Neg Hx    Stomach cancer Neg Hx       Current Outpatient Medications (Respiratory):    fluticasone (FLONASE) 50 MCG/ACT nasal spray, SPRAY 1 SPRAY INTO EACH NOSTRIL TWICE A DAY AS NEEDED    Current Outpatient Medications (Other):    Azelaic Acid (FINACEA) 15 % gel, Apply topically daily. After skin is thoroughly washed and patted dry, gently but thoroughly massage a thin film of azelaic acid cream into the affected area twice daily, in the morning and evening.   Multiple Vitamin (DAILY VITAMIN) tablet, Take 1 tablet by mouth.   sertraline (ZOLOFT) 25 MG tablet, TAKE 1 TABLET (25 MG TOTAL) BY MOUTH DAILY.   Reviewed prior external information including notes and imaging from  primary care provider As well as notes that were available from care everywhere and other healthcare systems.  Past medical history, social, surgical and family history all reviewed in electronic medical record.  No pertanent information unless stated regarding to the chief complaint.   Review of Systems:  No headache, visual changes, nausea, vomiting, diarrhea, constipation, dizziness, abdominal pain, skin rash, fevers, chills, night sweats, weight loss, swollen lymph nodes, body aches, joint swelling, chest pain, shortness of breath, mood changes. POSITIVE muscle aches  Objective  Blood pressure 110/72, pulse 77, height '5\' 7"'$  (1.702 m), weight 176 lb (79.8 kg), last menstrual period 11/09/2020, SpO2 98 %.   General: No apparent distress alert and oriented x3 mood and affect normal, dressed appropriately.  HEENT: Pupils equal, extraocular movements intact  Respiratory: Patient's speak in full sentences and does not appear short of breath  Cardiovascular: No lower extremity edema, non tender, no erythema  Right hip exam shows some limited range of motion in internal and external range of motion noted.  Does have some tightness noted in [place] right  greater than left.  Negative straight leg test noted today.  Minimal pain in the back.    Impression and Recommendations:     The above documentation has been reviewed and is accurate and complete Lyndal Pulley, DO

## 2022-04-23 NOTE — Patient Instructions (Signed)
PT [place] [facility] PT Hip abduction Continue Vit D PRP handout See me in 6-8 weeks

## 2022-04-24 DIAGNOSIS — M25551 Pain in right hip: Secondary | ICD-10-CM | POA: Insufficient documentation

## 2022-04-24 NOTE — Assessment & Plan Note (Signed)
Patient does have some findings consistent with a possible labral pathology.  We did discuss the potential for PRP which patient was interested in.  We discussed the pros and cons for this as well as what evidence there is for any labral pathology.  Patient does want to read about it and given a handout.  If patient decides to do this we can do it in the future.  In the interim will add physical therapy for the hip and see how she responds.

## 2022-05-02 DIAGNOSIS — M25551 Pain in right hip: Secondary | ICD-10-CM | POA: Diagnosis not present

## 2022-05-02 DIAGNOSIS — M542 Cervicalgia: Secondary | ICD-10-CM | POA: Diagnosis not present

## 2022-05-03 ENCOUNTER — Telehealth: Payer: Self-pay

## 2022-05-03 DIAGNOSIS — M9905 Segmental and somatic dysfunction of pelvic region: Secondary | ICD-10-CM | POA: Diagnosis not present

## 2022-05-03 DIAGNOSIS — M9904 Segmental and somatic dysfunction of sacral region: Secondary | ICD-10-CM | POA: Diagnosis not present

## 2022-05-03 DIAGNOSIS — M5127 Other intervertebral disc displacement, lumbosacral region: Secondary | ICD-10-CM | POA: Diagnosis not present

## 2022-05-03 DIAGNOSIS — M9901 Segmental and somatic dysfunction of cervical region: Secondary | ICD-10-CM | POA: Diagnosis not present

## 2022-05-03 NOTE — Telephone Encounter (Signed)
Request transfer to another [facility] PCP  Pt is requesting to transfer FROM:  Dr. Betty Martinique Pt is requesting to transfer TO: Dr. Howard Pouch Reason for requested transfer:  location Best contact number: [phone]

## 2022-05-06 NOTE — Telephone Encounter (Signed)
Okay with me 

## 2022-05-06 NOTE — Telephone Encounter (Signed)
Fine with me

## 2022-05-06 NOTE — Telephone Encounter (Signed)
Patient scheduled for [facility] visit 4/29 with Dr. Raoul Pitch.

## 2022-05-09 DIAGNOSIS — M542 Cervicalgia: Secondary | ICD-10-CM | POA: Diagnosis not present

## 2022-05-09 DIAGNOSIS — M25551 Pain in right hip: Secondary | ICD-10-CM | POA: Diagnosis not present

## 2022-05-16 DIAGNOSIS — M25551 Pain in right hip: Secondary | ICD-10-CM | POA: Diagnosis not present

## 2022-05-16 DIAGNOSIS — M542 Cervicalgia: Secondary | ICD-10-CM | POA: Diagnosis not present

## 2022-05-20 DIAGNOSIS — M9904 Segmental and somatic dysfunction of sacral region: Secondary | ICD-10-CM | POA: Diagnosis not present

## 2022-05-20 DIAGNOSIS — M9901 Segmental and somatic dysfunction of cervical region: Secondary | ICD-10-CM | POA: Diagnosis not present

## 2022-05-20 DIAGNOSIS — M9905 Segmental and somatic dysfunction of pelvic region: Secondary | ICD-10-CM | POA: Diagnosis not present

## 2022-05-20 DIAGNOSIS — M5127 Other intervertebral disc displacement, lumbosacral region: Secondary | ICD-10-CM | POA: Diagnosis not present

## 2022-05-22 DIAGNOSIS — N950 Postmenopausal bleeding: Secondary | ICD-10-CM | POA: Diagnosis not present

## 2022-05-27 ENCOUNTER — Ambulatory Visit: Payer: BC Managed Care – PPO | Admitting: Family Medicine

## 2022-06-04 DIAGNOSIS — R600 Localized edema: Secondary | ICD-10-CM | POA: Diagnosis not present

## 2022-06-04 DIAGNOSIS — I83893 Varicose veins of bilateral lower extremities with other complications: Secondary | ICD-10-CM | POA: Diagnosis not present

## 2022-06-04 DIAGNOSIS — R252 Cramp and spasm: Secondary | ICD-10-CM | POA: Diagnosis not present

## 2022-06-06 DIAGNOSIS — N939 Abnormal uterine and vaginal bleeding, unspecified: Secondary | ICD-10-CM | POA: Diagnosis not present

## 2022-06-12 DIAGNOSIS — M9904 Segmental and somatic dysfunction of sacral region: Secondary | ICD-10-CM | POA: Diagnosis not present

## 2022-06-12 DIAGNOSIS — M5127 Other intervertebral disc displacement, lumbosacral region: Secondary | ICD-10-CM | POA: Diagnosis not present

## 2022-06-12 DIAGNOSIS — M9901 Segmental and somatic dysfunction of cervical region: Secondary | ICD-10-CM | POA: Diagnosis not present

## 2022-06-12 DIAGNOSIS — M9905 Segmental and somatic dysfunction of pelvic region: Secondary | ICD-10-CM | POA: Diagnosis not present

## 2022-06-13 DIAGNOSIS — M542 Cervicalgia: Secondary | ICD-10-CM | POA: Diagnosis not present

## 2022-06-13 DIAGNOSIS — M25551 Pain in right hip: Secondary | ICD-10-CM | POA: Diagnosis not present

## 2022-06-13 NOTE — Progress Notes (Signed)
[name] [address] [place] [address] Phone: [phone] Subjective:   Bruce Donath, am serving as a scribe for Dr. Antoine Primas.  I'm seeing this patient by the request  of:  [place], Betty G, MD  CC: right hip pain   [id number]  3/35/2024 Patient does have some findings consistent with a possible labral pathology.  We did discuss the potential for PRP which patient was interested in.  We discussed the pros and cons for this as well as what evidence there is for any labral pathology.  Patient does want to read about it and given a handout.  If patient decides to do this we can do it in the future.  In the interim will add physical therapy for the hip and see how she responds.   Update 06/18/2022 Vergie Zahm Fischler is a 57 y.o. female coming in with complaint of R hip pain. Patient states that she is doing much better. Feels stronger and more stable. Wants to know how she can get back to 100% as she likes to hike and wants to be able to take big steps without pain.     Past Medical History:  Diagnosis Date   Allergy    Arthritis    Chicken pox    History of frequent urinary tract infections    Osteopenia    PONV (postoperative nausea and vomiting)    Past Surgical History:  Procedure Laterality Date   BREAST BIOPSY Left 2008   U/S Core- Benign   BREAST CYST ASPIRATION Left    BREAST CYST ASPIRATION Right    BREAST SURGERY     COLONOSCOPY     30 + yrs in [place]    MENISCUS REPAIR Right 2014   ROBOT ASSISTED MYOMECTOMY  2011   TONSILLECTOMY     Social History   Socioeconomic History   Marital status: Legally Separated    Spouse name: Not on file   Number of children: Not on file   Years of education: Not on file   Highest education level: Master's degree (e.g., MA, MS, MEng, MEd, MSW, MBA)  Occupational History   Not on file  Tobacco Use   Smoking status: Former   Smokeless tobacco: Never  Substance and Sexual  Activity   Alcohol use: Yes    Comment: social   Drug use: No   Sexual activity: Not Currently    Birth control/protection: None  Other Topics Concern   Not on file  Social History Narrative   Not on file   Social Determinants of Health   Financial Resource Strain: Low Risk  (08/07/2021)   Overall Financial Resource Strain (CARDIA)    Difficulty of Paying Living Expenses: Not hard at all  Food Insecurity: No Food Insecurity (08/07/2021)   Hunger Vital Sign    Worried About Running Out of Food in the Last Year: Never true    Ran Out of Food in the Last Year: Never true  Transportation Needs: No Transportation Needs (08/07/2021)   PRAPARE - Administrator, Civil Service (Medical): No    Lack of Transportation (Non-Medical): No  Physical Activity: Sufficiently Active (08/07/2021)   Exercise Vital Sign    Days of Exercise per Week: 5 days    Minutes of Exercise per Session: 40 min  Stress: Stress Concern Present (08/07/2021)   [employer] of Occupational Health - Occupational Stress Questionnaire    Feeling of Stress : To some extent  Social Connections: Socially Isolated (08/07/2021)   Social Connection and Isolation Panel [NHANES]    Frequency of Communication with Friends and Family: Once a week    Frequency of Social Gatherings with Friends and Family: Once a week    Attends Religious Services: Never    Database administrator or Organizations: No    Attends Engineer, structural: Not on file    Marital Status: Separated   Allergies  Allergen Reactions   Epinephrine     Lightheaded, things spin and "start to go dark", rapid HR per patient.   Latex Rash   Family History  Problem Relation Age of Onset   Arthritis Mother    Hyperlipidemia Mother    Heart disease Mother    Stroke Mother    Hypertension Mother    Diabetes Mother    Hyperlipidemia Father    Heart disease Father    Stroke Father    Hypertension Father    Breast cancer Maternal Aunt     Breast cancer Paternal Aunt    Colon polyps Neg Hx    Colon cancer Neg Hx    Esophageal cancer Neg Hx    Rectal cancer Neg Hx    Stomach cancer Neg Hx       Current Outpatient Medications (Respiratory):    fluticasone (FLONASE) 50 MCG/ACT nasal spray, SPRAY 1 SPRAY INTO EACH NOSTRIL TWICE A DAY AS NEEDED    Current Outpatient Medications (Other):    Azelaic Acid (FINACEA) 15 % gel, Apply topically daily. After skin is thoroughly washed and patted dry, gently but thoroughly massage a thin film of azelaic acid cream into the affected area twice daily, in the morning and evening.   Multiple Vitamin (DAILY VITAMIN) tablet, Take 1 tablet by mouth.   sertraline (ZOLOFT) 25 MG tablet, TAKE 1 TABLET (25 MG TOTAL) BY MOUTH DAILY.   Reviewed prior external information including notes and imaging from  primary care provider As well as notes that were available from care everywhere and other healthcare systems.  Past medical history, social, surgical and family history all reviewed in electronic medical record.  No pertanent information unless stated regarding to the chief complaint.   Review of Systems:  No headache, visual changes, nausea, vomiting, diarrhea, constipation, dizziness, abdominal pain, skin rash, fevers, chills, night sweats, weight loss, swollen lymph nodes, body aches, joint swelling, chest pain, shortness of breath, mood changes. POSITIVE muscle aches  Objective  Blood pressure 122/84, pulse 63, height 5\' 7"  (1.702 m), weight 182 lb (82.6 kg), last menstrual period 11/09/2020, SpO2 99 %.   General: No apparent distress alert and oriented x3 mood and affect normal, dressed appropriately.  HEENT: Pupils equal, extraocular movements intact  Respiratory: Patient's speak in full sentences and does not appear short of breath  Cardiovascular: No lower extremity edema, non tender, no erythema  Right hip exam does have some very mild tenderness over the greater trochanteric  area.  Moderate over the sacroiliac joint.  Seems to have some mild swelling noted in the area.  Osteopathic findings  T9 extended rotated and side bent left L2 flexed rotated and side bent right L5 flexed rotated and side bent left Sacrum right on right     Impression and Recommendations:    The above documentation has been reviewed and is accurate and complete Judi Saa, DO

## 2022-06-14 NOTE — H&P (Signed)
Patient name   Karen Short, Karen Short DICTATION# [id number] VWU#[id number]  Richardean Chimera, MD 06/14/2022 6:28 AM

## 2022-06-17 ENCOUNTER — Ambulatory Visit: Payer: BC Managed Care – PPO | Admitting: Family Medicine

## 2022-06-18 ENCOUNTER — Other Ambulatory Visit: Payer: Self-pay

## 2022-06-18 ENCOUNTER — Ambulatory Visit: Payer: BC Managed Care – PPO | Admitting: Family Medicine

## 2022-06-18 VITALS — BP 122/84 | HR 63 | Ht 67.0 in | Wt 182.0 lb

## 2022-06-18 DIAGNOSIS — M2550 Pain in unspecified joint: Secondary | ICD-10-CM

## 2022-06-18 DIAGNOSIS — M9903 Segmental and somatic dysfunction of lumbar region: Secondary | ICD-10-CM | POA: Diagnosis not present

## 2022-06-18 DIAGNOSIS — E559 Vitamin D deficiency, unspecified: Secondary | ICD-10-CM

## 2022-06-18 DIAGNOSIS — M25551 Pain in right hip: Secondary | ICD-10-CM

## 2022-06-18 DIAGNOSIS — M9904 Segmental and somatic dysfunction of sacral region: Secondary | ICD-10-CM

## 2022-06-18 DIAGNOSIS — M9902 Segmental and somatic dysfunction of thoracic region: Secondary | ICD-10-CM | POA: Diagnosis not present

## 2022-06-18 LAB — COMPREHENSIVE METABOLIC PANEL
ALT: 15 U/L (ref 0–35)
AST: 26 U/L (ref 0–37)
Albumin: 4 g/dL (ref 3.5–5.2)
Alkaline Phosphatase: 59 U/L (ref 39–117)
BUN: 11 mg/dL (ref 6–23)
CO2: 26 mEq/L (ref 19–32)
Calcium: 9.5 mg/dL (ref 8.4–10.5)
Chloride: 101 mEq/L (ref 96–112)
Creatinine, Ser: 0.81 mg/dL (ref 0.40–1.20)
GFR: 81.35 mL/min (ref >60.00)
Glucose, Bld: 73 mg/dL (ref 70–99)
Potassium: 4 mEq/L (ref 3.5–5.1)
Sodium: 135 mEq/L (ref 135–145)
Total Bilirubin: 0.5 mg/dL (ref 0.2–1.2)
Total Protein: 7.9 g/dL (ref 6.0–8.3)

## 2022-06-18 LAB — T3, FREE: T3, Free: 2.6 pg/mL (ref 2.3–4.2)

## 2022-06-18 LAB — CBC WITH DIFFERENTIAL/PLATELET
Basophils Absolute: 0 10*3/uL (ref 0.0–0.1)
Basophils Relative: 1.2 % (ref 0.0–3.0)
Eosinophils Absolute: 0.1 10*3/uL (ref 0.0–0.7)
Eosinophils Relative: 3.4 % (ref 0.0–5.0)
HCT: 39.8 % (ref 36.0–46.0)
Hemoglobin: 13.5 g/dL (ref 12.0–15.0)
Lymphocytes Relative: 21.1 % (ref 12.0–46.0)
Lymphs Abs: 0.7 10*3/uL (ref 0.7–4.0)
MCHC: 33.9 g/dL (ref 30.0–36.0)
MCV: 90.7 fl (ref 78.0–100.0)
Monocytes Absolute: 0.4 10*3/uL (ref 0.1–1.0)
Monocytes Relative: 11.6 % (ref 3.0–12.0)
Neutro Abs: 2.2 10*3/uL (ref 1.4–7.7)
Neutrophils Relative %: 62.7 % (ref 43.0–77.0)
Platelets: 209 10*3/uL (ref 150.0–400.0)
RBC: 4.39 Mil/uL (ref 3.87–5.11)
RDW: 13.7 % (ref 11.5–15.5)
WBC: 3.5 10*3/uL — ABNORMAL LOW (ref 4.0–10.5)

## 2022-06-18 LAB — VITAMIN B12: Vitamin B-12: 502 pg/mL (ref 211–911)

## 2022-06-18 LAB — IBC PANEL
Iron: 63 ug/dL (ref 42–145)
Saturation Ratios: 13.9 % — ABNORMAL LOW (ref 20.0–50.0)
TIBC: 452.2 ug/dL — ABNORMAL HIGH (ref 250.0–450.0)
Transferrin: 323 mg/dL (ref 212.0–360.0)

## 2022-06-18 LAB — VITAMIN D 25 HYDROXY (VIT D DEFICIENCY, FRACTURES): VITD: 71.59 ng/mL (ref 30.00–100.00)

## 2022-06-18 LAB — C-REACTIVE PROTEIN: CRP: <1 mg/dL (ref 0.5–20.0)

## 2022-06-18 LAB — T4, FREE: Free T4: 0.77 ng/dL (ref 0.60–1.60)

## 2022-06-18 LAB — SEDIMENTATION RATE: Sed Rate: 37 mm/hr — ABNORMAL HIGH (ref 0–30)

## 2022-06-18 LAB — URIC ACID: Uric Acid, Serum: 4.8 mg/dL (ref 2.4–7.0)

## 2022-06-18 LAB — FERRITIN: Ferritin: 21 ng/mL (ref 10.0–291.0)

## 2022-06-18 LAB — TSH: TSH: 2.79 u[IU]/mL (ref 0.35–5.50)

## 2022-06-18 NOTE — Assessment & Plan Note (Signed)
Patient does seem to have made significant improvement at this time.  Still concerned that she has not made significant improvement over the course of time to get her back to 100% at all.  Patient also has had some weight gain.  Wondering if anything else is potentially contributing to the pain and the weight gain that could be contributing to the obvious difficulty.  We will get laboratory workup to further evaluate.  Encourage patient to continue to do the home exercises and icing regimen.  Attempted osteopathic ablation as well which did make some improvement and patient felt more balanced.  Follow-up with me again in 6 to 8 weeks

## 2022-06-18 NOTE — Assessment & Plan Note (Signed)
Recheck vitamin D deficiency

## 2022-06-18 NOTE — Assessment & Plan Note (Signed)

## 2022-06-18 NOTE — H&P (Signed)
NAME: Karen Short, Karen Short [facility] MEDICAL RECORD NO: [id number] ACCOUNT NO: [id number] DATE OF BIRTH: Nov 10, 1965 PHYSICIAN: Juluis Mire, MD  History and Physical   DATE OF ADMISSION: 06/28/2022  Date of her surgery is 06/28/2022 at [facility] outpatient area.  HISTORY OF PRESENT ILLNESS:  In relation to the present admission, the patient is a 57 year old perimenopausal patient.  She has had episodes of continued postmenopausal or perimenopausal bleeding.  She does have a history of uterine fibroids.  Last year  we did a hysteroscopic resection of a submucosal fibroid.  Subsequent saline infusion ultrasound recently because of bleeding did reveal an endometrial polypoid outgrowth.  She did have several uterine fibroids, largest measuring 6.3 cm.  She now  presents for hysteroscopic evaluation and resection of polyp.  ALLERGIES:  SHE IS ALLERGIC TO EPINEPHRINE AND LATEX.  MEDICATIONS:  She is on sertraline 25 mg daily.  PAST MEDICAL HISTORY:  She had usual childhood diseases without any significant sequelae.  PAST SURGICAL HISTORY:  She has had a procedure on her knee.  Tonsillectomy.  Uterine myomectomy.  Breast biopsy.  FAMILY HISTORY:  Noncontributory.  SOCIAL HISTORY:  Reveals no tobacco, alcohol use.  REVIEW OF SYSTEMS:  Noncontributory.  PHYSICAL EXAMINATION:. VITAL SIGNS:  The patient is afebrile with stable vital signs. HEENT:  The patient is normocephalic.  Pupils equal, reactive to light and accommodation.  Extraocular movements were intact.  Sclerae and conjunctivae were clear.  Oropharynx was clear. BREASTS: Not examined. LUNGS:  Clear. CARDIOVASCULAR:  Regular rhythm and rate without murmurs or gallops. ABDOMEN:  Benign. PELVIC:  On pelvic normal external genitalia.  Vaginal mucosa is clear.  Cervix is unremarkable.  Uterus is enlarged and consistent with known fibroids.  It is approximately 9 weeks in size.  Adnexa unremarkable. EXTREMITIES:  Trace  edema. NEUROLOGIC:  Grossly within normal limits.  ASSESSMENT:  1.  Perimenopausal abnormal bleeding with evidence of endometrial polyp. 2.  Known uterine fibroids.  PLAN:  The patient undergo hysteroscopy.  Evaluation will be undertaken of the polyp and removed.  Risk of surgery explained including the risk of infection.  The risk of hemorrhage that could require transfusion with the risk of AIDS or hepatitis.   Excessive bleeding could require hysterectomy.  There is a risk of injury to adjacent organs such as bladder, bowel, ureters that could require further exploratory surgery.  Risk of deep venous thrombosis and pulmonary embolus.  The patient expressed  understanding of indications and risks.   PUS D: 06/14/2022 6:27:02 am T: 06/14/2022 2:28:00 pm  JOB: [id number]/ [id number]

## 2022-06-18 NOTE — Patient Instructions (Addendum)
Labs today  Try to get adjustable standing desk Keep doing exercise  See me in 6-8 weeks

## 2022-06-19 ENCOUNTER — Encounter (HOSPITAL_BASED_OUTPATIENT_CLINIC_OR_DEPARTMENT_OTHER): Payer: Self-pay | Admitting: Obstetrics and Gynecology

## 2022-06-20 LAB — CYCLIC CITRUL PEPTIDE ANTIBODY, IGG: Cyclic Citrullin Peptide Ab: <16 UNITS

## 2022-06-20 LAB — PTH, INTACT AND CALCIUM
Calcium: 9.4 mg/dL (ref 8.6–10.4)
PTH: 25 pg/mL (ref 16–77)

## 2022-06-20 LAB — ANTI-NUCLEAR AB-TITER (ANA TITER): ANA Titer 1: 1:40 {titer} — ABNORMAL HIGH

## 2022-06-20 LAB — ANGIOTENSIN CONVERTING ENZYME: Angiotensin-Converting Enzyme: 48 U/L (ref 9–67)

## 2022-06-20 LAB — CALCIUM, IONIZED: Calcium, Ion: 5.2 mg/dL (ref 4.7–5.5)

## 2022-06-20 LAB — THYROID PEROXIDASE ANTIBODIES (TPO) (REFL): Thyroperoxidase Ab SerPl-aCnc: 1 IU/mL (ref <9)

## 2022-06-20 LAB — RHEUMATOID FACTOR: Rheumatoid fact SerPl-aCnc: 14 IU/mL — ABNORMAL HIGH (ref <14)

## 2022-06-20 LAB — ANA: Anti Nuclear Antibody (ANA): POSITIVE — AB

## 2022-06-24 ENCOUNTER — Encounter (HOSPITAL_BASED_OUTPATIENT_CLINIC_OR_DEPARTMENT_OTHER): Payer: Self-pay | Admitting: Obstetrics and Gynecology

## 2022-06-24 NOTE — Progress Notes (Signed)
Spoke w/ via phone for pre-op interview--- pt Lab needs dos----   cbc, t&s, hiv            Lab results------ no COVID test -----patient states asymptomatic no test needed Arrive at ------- 0530 on 06-28-2022 NPO after MN NO Solid Food.  Clear liquids from MN until--- 0430 Med rec completed Medications to take morning of surgery ----- zoloft Diabetic medication ----- n/a Patient instructed no nail polish to be worn day of surgery Patient instructed to bring photo id and insurance card day of surgery Patient aware to have Driver (ride ) / caregiver    for 24 hours after surgery -- spouse, elinor Patient Special Instructions ----- n/a Pre-Op special Instructions ----- n/a Patient verbalized understanding of instructions that were given at this phone interview. Patient denies shortness of breath, chest pain, fever, cough at this phone interview.

## 2022-06-25 ENCOUNTER — Encounter: Payer: Self-pay | Admitting: Family Medicine

## 2022-06-26 ENCOUNTER — Other Ambulatory Visit: Payer: Self-pay

## 2022-06-26 DIAGNOSIS — R768 Other specified abnormal immunological findings in serum: Secondary | ICD-10-CM

## 2022-06-28 ENCOUNTER — Encounter (HOSPITAL_BASED_OUTPATIENT_CLINIC_OR_DEPARTMENT_OTHER): Payer: Self-pay | Admitting: Obstetrics and Gynecology

## 2022-06-28 ENCOUNTER — Encounter (HOSPITAL_BASED_OUTPATIENT_CLINIC_OR_DEPARTMENT_OTHER)
Admission: RE | Disposition: A | Discharge status: Home / Self Care | Payer: Self-pay | Source: Home / Self Care | Attending: Obstetrics and Gynecology

## 2022-06-28 ENCOUNTER — Ambulatory Visit (HOSPITAL_BASED_OUTPATIENT_CLINIC_OR_DEPARTMENT_OTHER): Payer: BC Managed Care – PPO | Admitting: Anesthesiology

## 2022-06-28 ENCOUNTER — Other Ambulatory Visit: Payer: Self-pay

## 2022-06-28 ENCOUNTER — Ambulatory Visit (HOSPITAL_BASED_OUTPATIENT_CLINIC_OR_DEPARTMENT_OTHER)
Admission: RE | Admit: 2022-06-28 | Discharge: 2022-06-28 | Disposition: A | Discharge status: Home / Self Care | Payer: BC Managed Care – PPO | Attending: Obstetrics and Gynecology | Admitting: Obstetrics and Gynecology

## 2022-06-28 DIAGNOSIS — M8580 Other specified disorders of bone density and structure, unspecified site: Secondary | ICD-10-CM | POA: Diagnosis not present

## 2022-06-28 DIAGNOSIS — Z87891 Personal history of nicotine dependence: Secondary | ICD-10-CM | POA: Diagnosis not present

## 2022-06-28 DIAGNOSIS — N840 Polyp of corpus uteri: Secondary | ICD-10-CM | POA: Diagnosis not present

## 2022-06-28 DIAGNOSIS — N939 Abnormal uterine and vaginal bleeding, unspecified: Secondary | ICD-10-CM | POA: Diagnosis not present

## 2022-06-28 DIAGNOSIS — D259 Leiomyoma of uterus, unspecified: Secondary | ICD-10-CM | POA: Diagnosis not present

## 2022-06-28 DIAGNOSIS — M1990 Unspecified osteoarthritis, unspecified site: Secondary | ICD-10-CM | POA: Diagnosis not present

## 2022-06-28 DIAGNOSIS — F411 Generalized anxiety disorder: Secondary | ICD-10-CM | POA: Insufficient documentation

## 2022-06-28 DIAGNOSIS — R9389 Abnormal findings on diagnostic imaging of other specified body structures: Secondary | ICD-10-CM | POA: Insufficient documentation

## 2022-06-28 DIAGNOSIS — Z01818 Encounter for other preprocedural examination: Secondary | ICD-10-CM

## 2022-06-28 HISTORY — DX: Generalized anxiety disorder: F41.1

## 2022-06-28 HISTORY — PX: DILATATION & CURETTAGE/HYSTEROSCOPY WITH MYOSURE: SHX6511

## 2022-06-28 HISTORY — DX: Segmental and somatic dysfunction of abdomen and other regions: M99.09

## 2022-06-28 HISTORY — DX: Presence of spectacles and contact lenses: Z97.3

## 2022-06-28 HISTORY — DX: Pain in unspecified joint: M25.50

## 2022-06-28 HISTORY — DX: Leiomyoma of uterus, unspecified: D25.9

## 2022-06-28 HISTORY — DX: Iron deficiency anemia, unspecified: D50.9

## 2022-06-28 HISTORY — DX: Polyp of corpus uteri: N84.0

## 2022-06-28 HISTORY — DX: Other seasonal allergic rhinitis: J30.2

## 2022-06-28 LAB — CBC
HCT: 35.8 % — ABNORMAL LOW (ref 36.0–46.0)
Hemoglobin: 12.2 g/dL (ref 12.0–15.0)
MCH: 31.2 pg (ref 26.0–34.0)
MCHC: 34.1 g/dL (ref 30.0–36.0)
MCV: 91.6 fL (ref 80.0–100.0)
Platelets: 202 10*3/uL (ref 150–400)
RBC: 3.91 MIL/uL (ref 3.87–5.11)
RDW: 12.8 % (ref 11.5–15.5)
WBC: 3.7 10*3/uL — ABNORMAL LOW (ref 4.0–10.5)
nRBC: 0 % (ref 0.0–0.2)

## 2022-06-28 LAB — ABO/RH: ABO/RH(D): A POS

## 2022-06-28 LAB — TYPE AND SCREEN
ABO/RH(D): A POS
Antibody Screen: NEGATIVE

## 2022-06-28 SURGERY — DILATATION & CURETTAGE/HYSTEROSCOPY WITH MYOSURE
Anesthesia: General

## 2022-06-28 MED ORDER — FENTANYL CITRATE (PF) 100 MCG/2ML IJ SOLN: 25.0000 ug | INTRAMUSCULAR | Status: DC | PRN

## 2022-06-28 MED ORDER — KETOROLAC TROMETHAMINE 30 MG/ML IJ SOLN
INTRAMUSCULAR | Status: DC | PRN
  Administered 2022-06-28: 30 mg via INTRAVENOUS

## 2022-06-28 MED ORDER — SCOPOLAMINE 1 MG/3DAYS TD PT72
MEDICATED_PATCH | TRANSDERMAL | Status: AC
  Filled 2022-06-28: qty 1

## 2022-06-28 MED ORDER — MIDAZOLAM HCL 2 MG/2ML IJ SOLN
INTRAMUSCULAR | Status: AC
  Filled 2022-06-28: qty 2

## 2022-06-28 MED ORDER — POVIDONE-IODINE 10 % EX SWAB: 2.0000 | Freq: Once | CUTANEOUS | Status: DC

## 2022-06-28 MED ORDER — FENTANYL CITRATE (PF) 100 MCG/2ML IJ SOLN
INTRAMUSCULAR | Status: AC
  Filled 2022-06-28: qty 2

## 2022-06-28 MED ORDER — ACETAMINOPHEN 500 MG PO TABS
1000.0000 mg | ORAL_TABLET | Freq: Once | ORAL | Status: AC
  Administered 2022-06-28: 1000 mg via ORAL

## 2022-06-28 MED ORDER — MIDAZOLAM HCL 2 MG/2ML IJ SOLN
INTRAMUSCULAR | Status: DC | PRN
  Administered 2022-06-28: 2 mg via INTRAVENOUS

## 2022-06-28 MED ORDER — ACETAMINOPHEN 500 MG PO TABS
ORAL_TABLET | ORAL | Status: AC
  Filled 2022-06-28: qty 2

## 2022-06-28 MED ORDER — CEFAZOLIN SODIUM-DEXTROSE 2-4 GM/100ML-% IV SOLN
2.0000 g | INTRAVENOUS | Status: AC
  Administered 2022-06-28: 2 g via INTRAVENOUS

## 2022-06-28 MED ORDER — LACTATED RINGERS IV SOLN: INTRAVENOUS | Status: DC

## 2022-06-28 MED ORDER — PROPOFOL 10 MG/ML IV BOLUS
INTRAVENOUS | Status: DC | PRN
  Administered 2022-06-28: 200 mg via INTRAVENOUS

## 2022-06-28 MED ORDER — SCOPOLAMINE 1 MG/3DAYS TD PT72
1.0000 | MEDICATED_PATCH | TRANSDERMAL | Status: DC
  Administered 2022-06-28: 1.5 mg via TRANSDERMAL

## 2022-06-28 MED ORDER — LIDOCAINE HCL 1 % IJ SOLN
INTRAMUSCULAR | Status: DC | PRN
  Administered 2022-06-28: 10 mL

## 2022-06-28 MED ORDER — SODIUM CHLORIDE 0.9 % IR SOLN
Status: DC | PRN
  Administered 2022-06-28: 3000 mL

## 2022-06-28 MED ORDER — LIDOCAINE HCL (CARDIAC) PF 100 MG/5ML IV SOSY
PREFILLED_SYRINGE | INTRAVENOUS | Status: DC | PRN
  Administered 2022-06-28: 50 mg via INTRAVENOUS

## 2022-06-28 MED ORDER — PHENYLEPHRINE HCL (PRESSORS) 10 MG/ML IV SOLN
INTRAVENOUS | Status: DC | PRN
  Administered 2022-06-28 (×2): 40 ug via INTRAVENOUS

## 2022-06-28 MED ORDER — EPHEDRINE SULFATE (PRESSORS) 50 MG/ML IJ SOLN
INTRAMUSCULAR | Status: DC | PRN
  Administered 2022-06-28 (×2): 5 mg via INTRAVENOUS

## 2022-06-28 MED ORDER — ONDANSETRON HCL 4 MG/2ML IJ SOLN
INTRAMUSCULAR | Status: DC | PRN
  Administered 2022-06-28: 4 mg via INTRAVENOUS

## 2022-06-28 MED ORDER — CEFAZOLIN SODIUM-DEXTROSE 2-4 GM/100ML-% IV SOLN
INTRAVENOUS | Status: AC
  Filled 2022-06-28: qty 100

## 2022-06-28 MED ORDER — DEXAMETHASONE SODIUM PHOSPHATE 4 MG/ML IJ SOLN
INTRAMUSCULAR | Status: DC | PRN
  Administered 2022-06-28: 8 mg via INTRAVENOUS

## 2022-06-28 MED ORDER — FENTANYL CITRATE (PF) 100 MCG/2ML IJ SOLN
INTRAMUSCULAR | Status: DC | PRN
  Administered 2022-06-28: 25 ug via INTRAVENOUS
  Administered 2022-06-28: 50 ug via INTRAVENOUS

## 2022-06-28 SURGICAL SUPPLY — 23 items
CATH ROBINSON RED A/P 16FR (CATHETERS) ×1 IMPLANT
DEVICE MYOSURE LITE (MISCELLANEOUS) IMPLANT
DEVICE MYOSURE REACH (MISCELLANEOUS) IMPLANT
DILATOR CANAL MILEX (MISCELLANEOUS) IMPLANT
DRSG TELFA 3X8 NADH STRL (GAUZE/BANDAGES/DRESSINGS) ×1 IMPLANT
GAUZE 4X4 16PLY ~~LOC~~+RFID DBL (SPONGE) ×2 IMPLANT
GLOVE BIO SURGEON STRL SZ7 (GLOVE) ×2 IMPLANT
GOWN STRL REUS W/TWL XL LVL3 (GOWN DISPOSABLE) ×1 IMPLANT
IV NS IRRIG 3000ML ARTHROMATIC (IV SOLUTION) ×1 IMPLANT
KIT PROCEDURE FLUENT (KITS) ×1 IMPLANT
KIT TURNOVER CYSTO (KITS) ×1 IMPLANT
LOOP CUTTING BIPOLAR 21FR (ELECTRODE) IMPLANT
MYOSURE XL FIBROID (MISCELLANEOUS)
NDL SPNL 18GX3.5 QUINCKE PK (NEEDLE) ×1 IMPLANT
NEEDLE SPNL 18GX3.5 QUINCKE PK (NEEDLE) ×1 IMPLANT
PACK VAGINAL MINOR WOMEN LF (CUSTOM PROCEDURE TRAY) ×1 IMPLANT
PAD OB MATERNITY 4.3X12.25 (PERSONAL CARE ITEMS) ×1 IMPLANT
PAD PREP 24X48 CUFFED NSTRL (MISCELLANEOUS) ×1 IMPLANT
SEAL ROD LENS SCOPE MYOSURE (ABLATOR) ×1 IMPLANT
SLEEVE SCD COMPRESS KNEE MED (STOCKING) ×1 IMPLANT
SYSTEM TISS REMOVAL MYOSURE XL (MISCELLANEOUS) IMPLANT
TOWEL OR 17X24 6PK STRL BLUE (TOWEL DISPOSABLE) ×2 IMPLANT
WATER STERILE IRR 500ML POUR (IV SOLUTION) ×1 IMPLANT

## 2022-06-28 NOTE — Anesthesia Procedure Notes (Signed)
Procedure Name: LMA Insertion Date/Time: 06/28/2022 7:29 AM  Performed by: Earmon Phoenix, CRNAPre-anesthesia Checklist: Patient identified, Emergency Drugs available, Suction available, Patient being monitored and Timeout performed Patient Re-evaluated:Patient Re-evaluated prior to induction Oxygen Delivery Method: Circle system utilized Preoxygenation: Pre-oxygenation with 100% oxygen Induction Type: IV induction Ventilation: Mask ventilation without difficulty LMA: LMA inserted LMA Size: 4.0 Number of attempts: 1 Placement Confirmation: positive ETCO2 and breath sounds checked- equal and bilateral Tube secured with: Tape Dental Injury: Teeth and Oropharynx as per pre-operative assessment

## 2022-06-28 NOTE — H&P (Signed)
  History and physical exam unchanged 

## 2022-06-28 NOTE — Anesthesia Postprocedure Evaluation (Signed)
Anesthesia Post Note  Patient: [place] [facility]grift [facility]  Procedure(s) Performed: DILATATION & CURETTAGE/HYSTEROSCOPY WITH MYOSURE     Patient location during evaluation: PACU Anesthesia Type: General Level of consciousness: awake and alert Pain management: pain level controlled Vital Signs Assessment: post-procedure vital signs reviewed and stable Respiratory status: spontaneous breathing, nonlabored ventilation, respiratory function stable and patient connected to nasal cannula oxygen Cardiovascular status: blood pressure returned to baseline and stable Postop Assessment: no apparent nausea or vomiting Anesthetic complications: no  No notable events documented.  Last Vitals:  Vitals:   06/28/22 0830 06/28/22 0916  BP:  117/77  Pulse:  62  Resp:  16  Temp: 36.6 C   SpO2:  99%    Last Pain:  Vitals:   06/28/22 0916  TempSrc:   PainSc: 0-No pain                 Navi Ewton L Davene Jobin

## 2022-06-28 NOTE — Op Note (Signed)
NAME: Karen, Short MEDICAL RECORD NO: [id number] ACCOUNT NO: [id number] DATE OF BIRTH: 1965/08/06 FACILITY: [facility] LOCATION: [facility] PHYSICIAN: Juluis Mire, MD  Operative Report   DATE OF PROCEDURE: 06/28/2022  PREOPERATIVE DIAGNOSIS:  Abnormal uterine bleed with endometrial thickening.  POSTOPERATIVE DIAGNOSIS:  Abnormal uterine bleed with endometrial thickening.  PROCEDURE:  Paracervical block.  Cervical dilation with hysteroscopy and resection of endometrium along with endometrial curettings.  SURGEON:  Juluis Mire, MD  ANESTHESIA:  General.  ESTIMATED BLOOD LOSS:  Minimal.  PACKS AND DRAINS: None.   INTRAOPERATIVE BLOOD PLACEMENT: None.  COMPLICATIONS: None.  INDICATIONS: As dictated in history and physical.  DESCRIPTION OF PROCEDURE:  As follows, the patient was taken to the OR and placed in supine position.  After satisfactory level of general anesthesia was obtained, the patient was placed in the dorsal lithotomy position using the Allen stirrups.   Perineum and vagina were prepped out with Betadine and draped as a sterile field.  Speculum was placed in the vaginal vault.  The cervix was grasped single tooth tenaculum.  Uterus sounded to approximately 9 cm.  Cervix was serially dilated.   Hysteroscope was introduced.  Intrauterine cavity was distended using saline.  Visualization did reveal areas of thickened endometrium.  We brought in the resectoscope.  All of these thickened areas were completely resected and sent to pathology.  Total  deficit was minimal.  There was no active bleeding or complications.  Hysteroscope was removed.  Endometrial curettings were then obtained.  Single tooth tenaculum was then removed.  The patient was taken out of dorsal lithotomy position.  Once alert and  extubated, transferred to recovery room in good condition.  Sponge, instrument and needle count was correct by circulating nurse x2.   VAI D: 06/28/2022 7:52:28 am T:  06/28/2022 8:20:00 am  JOB: [id number]/ [id number]

## 2022-06-28 NOTE — Anesthesia Preprocedure Evaluation (Addendum)
Anesthesia Evaluation  Patient identified by MRN, date of birth, ID band Patient awake    Reviewed: Allergy & Precautions, NPO status , Patient's Chart, lab work & pertinent test results  History of Anesthesia Complications (+) PONV and history of anesthetic complications  Airway Mallampati: II  TM Distance: >3 FB Neck ROM: Full    Dental no notable dental hx. (+) Teeth Intact, Dental Advisory Given   Pulmonary former smoker   Pulmonary exam normal breath sounds clear to auscultation       Cardiovascular negative cardio ROS Normal cardiovascular exam Rhythm:Regular Rate:Normal     Neuro/Psych  PSYCHIATRIC DISORDERS Anxiety     negative neurological ROS     GI/Hepatic negative GI ROS, Neg liver ROS,,,  Endo/Other  negative endocrine ROS    Renal/GU negative Renal ROS  negative genitourinary   Musculoskeletal  (+) Arthritis ,    Abdominal   Peds  Hematology negative hematology ROS (+)   Anesthesia Other Findings   Reproductive/Obstetrics                             Anesthesia Physical Anesthesia Plan  ASA: 2  Anesthesia Plan: General   Post-op Pain Management: Tylenol PO (pre-op)*   Induction: Intravenous  PONV Risk Score and Plan: 4 or greater and Ondansetron, Dexamethasone, Midazolam and Treatment may vary due to age or medical condition  Airway Management Planned: LMA  Additional Equipment:   Intra-op Plan:   Post-operative Plan: Extubation in OR  Informed Consent: I have reviewed the patients History and Physical, chart, labs and discussed the procedure including the risks, benefits and alternatives for the proposed anesthesia with the patient or authorized representative who has indicated his/her understanding and acceptance.     Dental advisory given  Plan Discussed with: CRNA  Anesthesia Plan Comments:        Anesthesia Quick Evaluation

## 2022-06-28 NOTE — Discharge Instructions (Signed)
  Post Anesthesia Home Care Instructions  Activity: Get plenty of rest for the remainder of the day. A responsible individual must stay with you for 24 hours following the procedure.  For the next 24 hours, DO NOT: -Drive a car -Advertising copywriter -Drink alcoholic beverages -Take any medication unless instructed by your physician -Make any legal decisions or sign important papers.  Meals: Start with liquid foods such as gelatin or soup. Progress to regular foods as tolerated. Avoid greasy, spicy, heavy foods. If nausea and/or vomiting occur, drink only clear liquids until the nausea and/or vomiting subsides. Call your physician if vomiting continues.  Special Instructions/Symptoms: Your throat may feel dry or sore from the anesthesia or the breathing tube placed in your throat during surgery. If this causes discomfort, gargle with warm salt water. The discomfort should disappear within 24 hours.  If you had a scopolamine patch placed behind your ear for the management of post- operative nausea and/or vomiting:  1. The medication in the patch is effective for 72 hours, after which it should be removed.  Wrap patch in a tissue and discard in the trash. Wash hands thoroughly with soap and water. 2. You may remove the patch earlier than 72 hours if you experience unpleasant side effects which may include dry mouth, dizziness or visual disturbances. 3. Avoid touching the patch. Wash your hands with soap and water after contact with the patch.     D & C Home care Instructions:   Personal hygiene:  Used sanitary napkins for vaginal drainage not tampons. Shower or tub bathe the day after your procedure. No douching until bleeding stops. Always wipe from front to back after  Elimination.  Activity: Do not drive or operate any equipment today. The effects of the anesthesia are still present and drowsiness may result. Rest today, not necessarily flat bed rest, just take it easy. You may resume your  normal activity in one to 2 days.  Sexual activity: No intercourse for one week or as indicated by your physician  Diet: Eat a light diet as desired this evening. You may resume a regular diet tomorrow.  Return to work: One to 2 days.  General Expectations of your surgery: Vaginal bleeding should be no heavier than a normal period. Spotting may continue up to 10 days. Mild cramps may continue for a couple of days. You may have a regular period in 2-6 weeks.  Unexpected observations call your doctor if these occur: persistent or heavy bleeding. Severe abdominal cramping or pain. Elevation of temperature greater than 100F.  Call for an appointment in one week.   You were given Tylenol prior to your surgical procedure today, you may take Tylenol after 1:00pm today

## 2022-06-28 NOTE — Brief Op Note (Signed)
06/28/2022  7:48 AM  PATIENT:  Karen Short  57 y.o. female  PRE-OPERATIVE DIAGNOSIS:  AUB, FIBROID, POSSIBLE POLYP  POST-OPERATIVE DIAGNOSIS:  AUB, FIBROID, POSSIBLE POLYP  PROCEDURE:  Procedure(s): DILATATION & CURETTAGE/HYSTEROSCOPY WITH MYOSURE (N/A)  SURGEON:  Surgeon(s) and Role:    * Skarleth Delmonico, MD - Primary  PHYSICIAN ASSISTANT:   ASSISTANTS: none   ANESTHESIA:   general and paracervical block  EBL:  minimal    BLOOD ADMINISTERED:none  DRAINS: none   LOCAL MEDICATIONS USED:  XYLOCAINE   SPECIMEN:  Source of Specimen:  uterine resection and currettings  DISPOSITION OF SPECIMEN:  PATHOLOGY  COUNTS:  YES  TOURNIQUET:  * No tourniquets in log *  DICTATION: .Other Dictation: Dictation Number [id number]  PLAN OF CARE: Discharge to home after PACU  PATIENT DISPOSITION:  PACU - hemodynamically stable.   Delay start of Pharmacological VTE agent (>24hrs) due to surgical blood loss or risk of bleeding: not applicable

## 2022-06-28 NOTE — Transfer of Care (Signed)
Immediate Anesthesia Transfer of Care Note  Patient: Karen Short [facility]  Procedure(s) Performed: DILATATION & CURETTAGE/HYSTEROSCOPY WITH MYOSURE  Patient Location: PACU  Anesthesia Type:General  Level of Consciousness: awake and patient cooperative  Airway & Oxygen Therapy: Patient Spontanous Breathing and Patient connected to nasal cannula oxygen  Post-op Assessment: Report given to RN and Post -op Vital signs reviewed and stable  Post vital signs: Reviewed and stable  Last Vitals:  Vitals Value Taken Time  BP 117/76 06/28/22 0802  Temp    Pulse 72 06/28/22 0803  Resp 12 06/28/22 0803  SpO2 100 % 06/28/22 0803  Vitals shown include unvalidated device data.  Last Pain:  Vitals:   06/28/22 0616  TempSrc: Oral  PainSc: 0-No pain      Patients Stated Pain Goal: 6 (06/28/22 1610)  Complications: No notable events documented.

## 2022-07-01 LAB — SURGICAL PATHOLOGY

## 2022-07-03 ENCOUNTER — Encounter (HOSPITAL_BASED_OUTPATIENT_CLINIC_OR_DEPARTMENT_OTHER): Payer: Self-pay | Admitting: Obstetrics and Gynecology

## 2022-07-03 DIAGNOSIS — M5127 Other intervertebral disc displacement, lumbosacral region: Secondary | ICD-10-CM | POA: Diagnosis not present

## 2022-07-03 DIAGNOSIS — M9901 Segmental and somatic dysfunction of cervical region: Secondary | ICD-10-CM | POA: Diagnosis not present

## 2022-07-03 DIAGNOSIS — M9905 Segmental and somatic dysfunction of pelvic region: Secondary | ICD-10-CM | POA: Diagnosis not present

## 2022-07-03 DIAGNOSIS — M9904 Segmental and somatic dysfunction of sacral region: Secondary | ICD-10-CM | POA: Diagnosis not present

## 2022-07-04 DIAGNOSIS — Z09 Encounter for follow-up examination after completed treatment for conditions other than malignant neoplasm: Secondary | ICD-10-CM | POA: Diagnosis not present

## 2022-07-04 DIAGNOSIS — M25551 Pain in right hip: Secondary | ICD-10-CM | POA: Diagnosis not present

## 2022-07-04 DIAGNOSIS — M542 Cervicalgia: Secondary | ICD-10-CM | POA: Diagnosis not present

## 2022-07-22 DIAGNOSIS — M25551 Pain in right hip: Secondary | ICD-10-CM | POA: Diagnosis not present

## 2022-07-22 DIAGNOSIS — M542 Cervicalgia: Secondary | ICD-10-CM | POA: Diagnosis not present

## 2022-07-23 ENCOUNTER — Encounter: Payer: Self-pay | Admitting: Family Medicine

## 2022-07-23 ENCOUNTER — Ambulatory Visit (INDEPENDENT_AMBULATORY_CARE_PROVIDER_SITE_OTHER): Payer: BC Managed Care – PPO | Admitting: Family Medicine

## 2022-07-23 VITALS — BP 123/86 | HR 78 | Temp 98.0°F | Ht 67.0 in | Wt 181.2 lb

## 2022-07-23 DIAGNOSIS — Z823 Family history of stroke: Secondary | ICD-10-CM

## 2022-07-23 DIAGNOSIS — T753XXA Motion sickness, initial encounter: Secondary | ICD-10-CM

## 2022-07-23 DIAGNOSIS — Z7689 Persons encountering health services in other specified circumstances: Secondary | ICD-10-CM

## 2022-07-23 DIAGNOSIS — R768 Other specified abnormal immunological findings in serum: Secondary | ICD-10-CM | POA: Diagnosis not present

## 2022-07-23 DIAGNOSIS — M8580 Other specified disorders of bone density and structure, unspecified site: Secondary | ICD-10-CM | POA: Insufficient documentation

## 2022-07-23 DIAGNOSIS — F419 Anxiety disorder, unspecified: Secondary | ICD-10-CM | POA: Diagnosis not present

## 2022-07-23 DIAGNOSIS — E559 Vitamin D deficiency, unspecified: Secondary | ICD-10-CM

## 2022-07-23 DIAGNOSIS — Z8249 Family history of ischemic heart disease and other diseases of the circulatory system: Secondary | ICD-10-CM

## 2022-07-23 DIAGNOSIS — Z2989 Encounter for other specified prophylactic measures: Secondary | ICD-10-CM | POA: Diagnosis not present

## 2022-07-23 MED ORDER — SCOPOLAMINE 1 MG/3DAYS TD PT72: 1.0000 | MEDICATED_PATCH | TRANSDERMAL | 0 refills | Status: DC

## 2022-07-23 MED ORDER — ONDANSETRON 4 MG PO TBDP: 4.0000 mg | ORAL_TABLET | Freq: Three times a day (TID) | ORAL | 0 refills | Status: DC | PRN

## 2022-07-23 MED ORDER — ACETAZOLAMIDE 125 MG PO TABS
125.00 mg | ORAL_TABLET | Freq: Two times a day (BID) | ORAL | 0 refills | Status: DC
Start: 2022-07-23 — End: 2022-08-23

## 2022-07-23 NOTE — Progress Notes (Signed)
Patient ID: Karen Short, female  DOB: 1966/02/04, 57 y.o.   MRN: [id number] Patient Care Team    Relationship Specialty Notifications Start End  Natalia Leatherwood, DO PCP - General Family Medicine  07/23/22   Richardean Chimera, MD Consulting Physician Obstetrics and Gynecology  07/25/17   Beryle Flock, PT Physical Therapist Physical Therapy  08/17/19   Elise Benne, MD Consulting Physician Ophthalmology  07/18/22   Fuller Plan, MD Consulting Physician Rheumatology  07/18/22   Judi Saa, DO Consulting Physician Family Medicine  07/18/22   Carman Ching, MD (Inactive) Consulting Physician Gastroenterology  07/23/22     Chief Complaint  Patient presents with   Medication for traveling    Subjective: Karen Short is a 57 y.o.  female present for new patient establishment this provider/transfer of care from another [facility] provider All past medical history, surgical history, allergies, family history, immunizations, medications and social history were updated in the electronic medical record today. All recent labs, ED visits and hospitalizations within the last year were reviewed.  Anxiety disorder: Patient reports compliance with Zoloft 25 mg daily.  She reports she is traveling soon in which she will be at a higher elevation and would like a medication to help with altitude sickness.  She we will also spend part of that time on a cruise ship and would like medication to help with seasickness.     07/23/2022    2:19 PM 01/25/2022    8:04 AM 08/10/2021    1:13 PM 12/29/2020   11:27 AM 08/31/2018    2:12 PM  Depression screen PHQ 2/9  Decreased Interest 0 0 0 0 0  Down, Depressed, Hopeless 0 0 0 0 0  PHQ - 2 Score 0 0 0 0 0  Altered sleeping  0 0    Tired, decreased energy  1 1    Change in appetite  0 0    Feeling bad or failure about yourself   0 0    Trouble concentrating  0 0    Moving slowly or fidgety/restless  0 0    Suicidal thoughts  0 0    PHQ-9 Score   1 1    Difficult doing work/chores  Not difficult at all Not difficult at all         No data to display                 07/23/2022    2:19 PM 01/25/2022    8:04 AM 08/10/2021    1:13 PM  Fall Risk   Falls in the past year? 0 0 0  Number falls in past yr: 0 0 0  Injury with Fall? 0 0 0  Risk for fall due to : No Fall Risks No Fall Risks No Fall Risks  Follow up Falls evaluation completed Falls evaluation completed Falls evaluation completed    Immunization History  Administered Date(s) Administered   Influenza Inj Mdck Quad Pf 12/18/2016, 10/28/2021   Influenza, Seasonal, Injecte, Preservative Fre 12/18/2016   Influenza,inj,Quad PF,6+ Mos 01/10/2018, 12/21/2019   Influenza,inj,quad, With Preservative 12/18/2016   Influenza-Unspecified 11/24/2020   PFIZER Comirnaty(Gray Top)Covid-19 Tri-Sucrose Vaccine 11/02/2021   PFIZER(Purple Top)SARS-COV-2 Vaccination 05/07/2019, 05/28/2019, 11/14/2020   Tdap 01/25/2022    No results found.  Past Medical History:  Diagnosis Date   Arthritis    Endometrial polyp    GAD (generalized anxiety disorder)    History of frequent urinary tract infections  Iron deficiency anemia    Osteopenia    Polyarthralgia    06-24-2022  per pt just tested positive RA this past friday 06-21-2022, test done by orthopedic   PONV (postoperative nausea and vomiting)    Seasonal allergies    Somatic dysfunction of back    Uterine fibroid    Wears glasses    Allergies  Allergen Reactions   Epinephrine     Lightheaded, things spin and "start to go dark", rapid HR per patient.   Latex Rash   Past Surgical History:  Procedure Laterality Date   BREAST LUMPECTOMY Left 2008   benign;2018/2022/2023   COLONOSCOPY  2019   DILATATION & CURETTAGE/HYSTEROSCOPY WITH MYOSURE N/A 06/28/2022   Procedure: DILATATION & CURETTAGE/HYSTEROSCOPY WITH MYOSURE;  Surgeon: Richardean Chimera, MD;  Location: [facility];  Service: Gynecology;  Laterality: N/A;    HYSTEROSCOPY WITH D & C  09/2021   06/2022   KNEE ARTHROSCOPY W/ MENISCAL REPAIR Right 2014   LAPAROSCOPIC GELPORT ASSISTED MYOMECTOMY  2011   TONSILLECTOMY     child-1973   Family History  Problem Relation Age of Onset   Kidney disease Mother    Miscarriages / [place] Mother    COPD Mother    Cancer Mother        skin   Arthritis Mother    Hyperlipidemia Mother    Heart disease Mother    Stroke Mother    Hypertension Mother    Diabetes Mother    Heart attack Mother    Heart attack Father    Hearing loss Father    Cancer Father        skin   Arthritis Father    Heart disease Father    Stroke Father    Hypertension Father    Asthma Brother    Hearing loss Brother    Arthritis Brother    Breast cancer Maternal Aunt    Breast cancer Paternal Aunt    Colon polyps Neg Hx    Colon cancer Neg Hx    Esophageal cancer Neg Hx    Rectal cancer Neg Hx    Stomach cancer Neg Hx    Social History   Social History Narrative   Marital status/children/pets: Divorced, G0P0   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   Safety:         -smoke alarm in the home:Yes     - wears seatbelt: Yes     - Feels safe in their relationships: Yes       Allergies as of 07/23/2022       Reactions   Epinephrine    Lightheaded, things spin and "start to go dark", rapid HR per patient.   Latex Rash        Medication List        Accurate as of July 23, 2022 11:59 PM. If you have any questions, ask your nurse or doctor.          acetaZOLAMIDE 125 MG tablet Commonly known as: DIAMOX Take 1 tablet (125 mg total) by mouth 2 (two) times daily. Start day prior to trip. Started by: Felix Pacini, DO   ALLERGY EYE DROPS OP Apply to eye as needed.   Daily Vitamin tablet Take 1 tablet by mouth daily. Take 1 tablet by mouth.   Finacea 15 % gel Generic drug: Azelaic Acid Apply topically 2 (two) times daily. After skin is thoroughly washed and patted dry, gently but thoroughly  massage a thin film of azelaic acid  cream into the affected area twice daily, in the morning and evening.   fluticasone 50 MCG/ACT nasal spray Commonly known as: FLONASE SPRAY 1 SPRAY INTO EACH NOSTRIL TWICE A DAY AS NEEDED What changed: See the new instructions.   L-glutamine 5 g Pack Powder Packet Commonly known as: ENDARI Take by mouth 2 (two) times daily.   ondansetron 4 MG disintegrating tablet Commonly known as: ZOFRAN-ODT Take 1 tablet (4 mg total) by mouth every 8 (eight) hours as needed for nausea or vomiting. Started by: Felix Pacini, DO   scopolamine 1 MG/3DAYS Commonly known as: TRANSDERM-SCOP Place 1 patch (1.5 mg total) onto the skin every 3 (three) days. Place patch at least 4 hours before boarding ship Started by: Felix Pacini, DO   sertraline 25 MG tablet Commonly known as: ZOLOFT TAKE 1 TABLET (25 MG TOTAL) BY MOUTH DAILY.   VITAMIN D-VITAMIN K PO Take by mouth. 4000/100        All past medical history, surgical history, allergies, family history, immunizations andmedications were updated in the EMR today and reviewed under the history and medication portions of their EMR.      No results found.   ROS 14 pt review of systems performed and negative (unless mentioned in an HPI)  Objective: BP 123/86   Pulse 78   Temp 98 F (36.7 C)   Ht 5\' 7"  (1.702 m)   Wt 181 lb 3.2 oz (82.2 kg)   LMP 11/09/2020   SpO2 98%   BMI 28.38 kg/m  Physical Exam Vitals and nursing note reviewed.  Constitutional:      General: She is not in acute distress.    Appearance: Normal appearance. She is normal weight. She is not ill-appearing or toxic-appearing.  HENT:     Head: Normocephalic and atraumatic.  Eyes:     General: No scleral icterus.       Right eye: No discharge.        Left eye: No discharge.     Extraocular Movements: Extraocular movements intact.     Conjunctiva/sclera: Conjunctivae normal.     Pupils: Pupils are equal, round, and reactive to light.   Skin:    Findings: No rash.  Neurological:     Mental Status: She is alert and oriented to person, place, and time. Mental status is at baseline.     Motor: No weakness.     Coordination: Coordination normal.     Gait: Gait normal.  Psychiatric:        Mood and Affect: Mood normal.        Behavior: Behavior normal.        Thought Content: Thought content normal.        Judgment: Judgment normal.        Assessment/plan: Karen Short is a 57 y.o. female present for TOC-establishing care with new physician Anxiety disorder, unspecified type Continue Zoloft 25 mg daily. We discussed trial of Cymbalta if she returns from her trip may help with the hot flashes, anxiety and the muscle skeletal complaints.   ANA positive-rheumatoid factor positive/polyarthralgia Consider anti-inflammatory plus minus Cymbalta in place of Zoloft. She will make an appointment when she is home from her vacation and we will discuss in more detail at that time  Seasickness: Scopolamine patches prescribed with instructions on appropriate use Preventative altitude sickness: Diamox 125 mg twice daily.  Start day prior  Return in about 11 weeks (around 10/08/2022).  No orders of the defined types were placed in  this encounter.  Meds ordered this encounter  Medications   acetaZOLAMIDE (DIAMOX) 125 MG tablet    Sig: Take 1 tablet (125 mg total) by mouth 2 (two) times daily. Start day prior to trip.    Dispense:  30 tablet    Refill:  0   scopolamine (TRANSDERM-SCOP) 1 MG/3DAYS    Sig: Place 1 patch (1.5 mg total) onto the skin every 3 (three) days. Place patch at least 4 hours before boarding ship    Dispense:  4 patch    Refill:  0   ondansetron (ZOFRAN-ODT) 4 MG disintegrating tablet    Sig: Take 1 tablet (4 mg total) by mouth every 8 (eight) hours as needed for nausea or vomiting.    Dispense:  20 tablet    Refill:  0   Referral Orders  No referral(s) requested today     Note is  dictated utilizing voice recognition software. Although note has been proof read prior to signing, occasional typographical errors still can be missed. If any questions arise, please do not hesitate to call for verification.  Electronically signed by: Felix Pacini, DO [facility]

## 2022-07-23 NOTE — Patient Instructions (Addendum)
Return in about 11 weeks (around 10/08/2022).        Great to see you today.  I have refilled the medication(s) we provide.   If labs were collected, we will inform you of lab results once received either by echart message or telephone call.   - echart message- for normal results that have been seen by the patient already.   - telephone call: abnormal results or if patient has not viewed results in their echart.

## 2022-07-24 DIAGNOSIS — M9905 Segmental and somatic dysfunction of pelvic region: Secondary | ICD-10-CM | POA: Diagnosis not present

## 2022-07-24 DIAGNOSIS — M9901 Segmental and somatic dysfunction of cervical region: Secondary | ICD-10-CM | POA: Diagnosis not present

## 2022-07-24 DIAGNOSIS — M9904 Segmental and somatic dysfunction of sacral region: Secondary | ICD-10-CM | POA: Diagnosis not present

## 2022-07-24 DIAGNOSIS — M5127 Other intervertebral disc displacement, lumbosacral region: Secondary | ICD-10-CM | POA: Diagnosis not present

## 2022-07-24 NOTE — Progress Notes (Unsigned)
[name] [address] [place] [address] Phone: [phone] Subjective:   Karen Short, am serving as a scribe for Dr. Antoine Primas.  I'm seeing this patient by the request  of:  Kuneff, Renee A, DO  CC: back and neck pain   [id number]  Karen Short is a 57 y.o. female coming in with complaint of back and neck pain.OMT 06/18/2022. Patient states that she is doing well. Would like to discuss labs.   Medications patient has been prescribed: None  Taking:      Patient at last exam did have x-rays of the hip showing that there was mild bilateral sacroiliac and femoral acetabular arthritic changes.   Reviewed prior external information including notes and imaging from previsou exam, outside providers and external EMR if available.   As well as notes that were available from care everywhere and other healthcare systems.  Past medical history, social, surgical and family history all reviewed in electronic medical record.  No pertanent information unless stated regarding to the chief complaint.   Past Medical History:  Diagnosis Date   Arthritis    Endometrial polyp    GAD (generalized anxiety disorder)    History of frequent urinary tract infections    Iron deficiency anemia    Osteopenia    Polyarthralgia    06-24-2022  per pt just tested positive RA this past friday 06-21-2022, test done by orthopedic   PONV (postoperative nausea and vomiting)    Seasonal allergies    Somatic dysfunction of back    Uterine fibroid    Wears glasses     Allergies  Allergen Reactions   Epinephrine     Lightheaded, things spin and "start to go dark", rapid HR per patient.   Latex Rash     Review of Systems:  No headache, visual changes, nausea, vomiting, diarrhea, constipation, dizziness, abdominal pain, skin rash, fevers, chills, night sweats, weight loss, swollen lymph nodes, body aches, joint swelling, chest pain, shortness of  breath, mood changes. POSITIVE muscle aches  Objective  Blood pressure 118/82, pulse 75, height 5\' 7"  (1.702 m), weight 181 lb (82.1 kg), last menstrual period 11/09/2020, SpO2 99 %.   General: No apparent distress alert and oriented x3 mood and affect normal, dressed appropriately.  HEENT: Pupils equal, extraocular movements intact  Respiratory: Patient's speak in full sentences and does not appear short of breath  Cardiovascular: No lower extremity edema, non tender, no erythema  Low back does have some loss of lordosis noted.  Some tenderness to palpation in the paraspinal musculature.  Osteopathic findings  C2 flexed rotated and side bent right C7 flexed rotated and side bent left T3 extended rotated and side bent right inhaled rib T7 extended rotated and side bent left L2 flexed rotated and side bent right Sacrum right on right       Assessment and Plan:  Rheumatoid factor positive Patient is going to be following up with rheumatology in September.  Likely will be no change in management but I do think the patient should establish.  Follow-up again with me though in 2 to 3 months for her other difficulties.  Right hip pain Known mild femoral acetabular impingement.  No sign of any erosive changes on x-ray.  Will continue to monitor the sacroiliac arthritis minorly noted as well but responding well to manipulation.  Discussed core strengthening.  Follow-up again in 6 to 8 weeks    Nonallopathic problems  Decision today  to treat with OMT was based on Physical Exam  After verbal consent patient was treated with HVLA, ME, FPR techniques in cervical, rib, thoracic, lumbar, and sacral  areas  Patient tolerated the procedure well with improvement in symptoms  Patient given exercises, stretches and lifestyle modifications  See medications in patient instructions if given  Patient will follow up in 4-8 weeks     The above documentation has been reviewed and is accurate  and complete Judi Saa, DO         Note: This dictation was prepared with Dragon dictation along with smaller phrase technology. Any transcriptional errors that result from this process are unintentional.

## 2022-07-30 ENCOUNTER — Ambulatory Visit (INDEPENDENT_AMBULATORY_CARE_PROVIDER_SITE_OTHER): Payer: BC Managed Care – PPO | Admitting: Family Medicine

## 2022-07-30 VITALS — BP 118/82 | HR 75 | Ht 67.0 in | Wt 181.0 lb

## 2022-07-30 DIAGNOSIS — M9902 Segmental and somatic dysfunction of thoracic region: Secondary | ICD-10-CM | POA: Diagnosis not present

## 2022-07-30 DIAGNOSIS — M25551 Pain in right hip: Secondary | ICD-10-CM

## 2022-07-30 DIAGNOSIS — M9908 Segmental and somatic dysfunction of rib cage: Secondary | ICD-10-CM | POA: Diagnosis not present

## 2022-07-30 DIAGNOSIS — M9901 Segmental and somatic dysfunction of cervical region: Secondary | ICD-10-CM | POA: Diagnosis not present

## 2022-07-30 DIAGNOSIS — M9903 Segmental and somatic dysfunction of lumbar region: Secondary | ICD-10-CM

## 2022-07-30 DIAGNOSIS — M9904 Segmental and somatic dysfunction of sacral region: Secondary | ICD-10-CM

## 2022-07-30 DIAGNOSIS — R768 Other specified abnormal immunological findings in serum: Secondary | ICD-10-CM

## 2022-07-30 NOTE — Assessment & Plan Note (Signed)
Known mild femoral acetabular impingement.  No sign of any erosive changes on x-ray.  Will continue to monitor the sacroiliac arthritis minorly noted as well but responding well to manipulation.  Discussed core strengthening.  Follow-up again in 6 to 8 weeks

## 2022-07-30 NOTE — Assessment & Plan Note (Signed)
Patient is going to be following up with rheumatology in September.  Likely will be no change in management but I do think the patient should establish.  Follow-up again with me though in 2 to 3 months for her other difficulties.

## 2022-08-02 DIAGNOSIS — M9901 Segmental and somatic dysfunction of cervical region: Secondary | ICD-10-CM | POA: Diagnosis not present

## 2022-08-02 DIAGNOSIS — M9904 Segmental and somatic dysfunction of sacral region: Secondary | ICD-10-CM | POA: Diagnosis not present

## 2022-08-02 DIAGNOSIS — M5127 Other intervertebral disc displacement, lumbosacral region: Secondary | ICD-10-CM | POA: Diagnosis not present

## 2022-08-02 DIAGNOSIS — M9905 Segmental and somatic dysfunction of pelvic region: Secondary | ICD-10-CM | POA: Diagnosis not present

## 2022-08-03 ENCOUNTER — Other Ambulatory Visit: Payer: Self-pay | Admitting: Family Medicine

## 2022-08-12 DIAGNOSIS — M9905 Segmental and somatic dysfunction of pelvic region: Secondary | ICD-10-CM | POA: Diagnosis not present

## 2022-08-12 DIAGNOSIS — M9904 Segmental and somatic dysfunction of sacral region: Secondary | ICD-10-CM | POA: Diagnosis not present

## 2022-08-12 DIAGNOSIS — M9901 Segmental and somatic dysfunction of cervical region: Secondary | ICD-10-CM | POA: Diagnosis not present

## 2022-08-12 DIAGNOSIS — M5127 Other intervertebral disc displacement, lumbosacral region: Secondary | ICD-10-CM | POA: Diagnosis not present

## 2022-08-20 ENCOUNTER — Telehealth: Payer: Self-pay

## 2022-08-20 NOTE — Telephone Encounter (Signed)
Pt scheduled for Friday; suggested f/u from June is to Return in about 11 weeks (around 10/08/2022).

## 2022-08-23 ENCOUNTER — Encounter: Payer: Self-pay | Admitting: Family Medicine

## 2022-08-23 ENCOUNTER — Ambulatory Visit (INDEPENDENT_AMBULATORY_CARE_PROVIDER_SITE_OTHER): Payer: BC Managed Care – PPO | Admitting: Family Medicine

## 2022-08-23 VITALS — BP 131/85 | HR 65 | Temp 98.3°F | Ht 67.0 in | Wt 181.0 lb

## 2022-08-23 DIAGNOSIS — R768 Other specified abnormal immunological findings in serum: Secondary | ICD-10-CM | POA: Diagnosis not present

## 2022-08-23 DIAGNOSIS — F411 Generalized anxiety disorder: Secondary | ICD-10-CM | POA: Diagnosis not present

## 2022-08-23 DIAGNOSIS — M2550 Pain in unspecified joint: Secondary | ICD-10-CM | POA: Diagnosis not present

## 2022-08-23 MED ORDER — DULOXETINE HCL 20 MG PO CPEP: 20.0000 mg | ORAL_CAPSULE | Freq: Every day | ORAL | 1 refills | Status: DC
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
## 2022-08-23 NOTE — Patient Instructions (Signed)
Return in about 5 months (around 01/27/2023) for cpe (20 min), Routine chronic condition follow-up.        Great to see you today.  I have refilled the medication(s) we provide.   If labs were collected, we will inform you of lab results once received either by echart message or telephone call.   - echart message- for normal results that have been seen by the patient already.   - telephone call: abnormal results or if patient has not viewed results in their echart.

## 2022-08-28 ENCOUNTER — Other Ambulatory Visit: Payer: Self-pay | Admitting: Obstetrics and Gynecology

## 2022-08-28 ENCOUNTER — Encounter: Payer: Self-pay | Admitting: Obstetrics and Gynecology

## 2022-08-28 DIAGNOSIS — Z8249 Family history of ischemic heart disease and other diseases of the circulatory system: Secondary | ICD-10-CM

## 2022-08-28 DIAGNOSIS — Z6828 Body mass index (BMI) 28.0-28.9, adult: Secondary | ICD-10-CM | POA: Diagnosis not present

## 2022-08-28 DIAGNOSIS — Z01419 Encounter for gynecological examination (general) (routine) without abnormal findings: Secondary | ICD-10-CM | POA: Diagnosis not present

## 2022-08-28 DIAGNOSIS — Z720 Tobacco use: Secondary | ICD-10-CM

## 2022-08-30 DIAGNOSIS — M5127 Other intervertebral disc displacement, lumbosacral region: Secondary | ICD-10-CM | POA: Diagnosis not present

## 2022-08-30 DIAGNOSIS — M9904 Segmental and somatic dysfunction of sacral region: Secondary | ICD-10-CM | POA: Diagnosis not present

## 2022-08-30 DIAGNOSIS — M9901 Segmental and somatic dysfunction of cervical region: Secondary | ICD-10-CM | POA: Diagnosis not present

## 2022-08-30 DIAGNOSIS — M9905 Segmental and somatic dysfunction of pelvic region: Secondary | ICD-10-CM | POA: Diagnosis not present

## 2022-09-06 DIAGNOSIS — M9904 Segmental and somatic dysfunction of sacral region: Secondary | ICD-10-CM | POA: Diagnosis not present

## 2022-09-06 DIAGNOSIS — M5127 Other intervertebral disc displacement, lumbosacral region: Secondary | ICD-10-CM | POA: Diagnosis not present

## 2022-09-06 DIAGNOSIS — M9901 Segmental and somatic dysfunction of cervical region: Secondary | ICD-10-CM | POA: Diagnosis not present

## 2022-09-06 DIAGNOSIS — M9905 Segmental and somatic dysfunction of pelvic region: Secondary | ICD-10-CM | POA: Diagnosis not present

## 2022-09-10 ENCOUNTER — Ambulatory Visit: Payer: BC Managed Care – PPO | Admitting: Family Medicine

## 2022-09-11 DIAGNOSIS — I83891 Varicose veins of right lower extremities with other complications: Secondary | ICD-10-CM | POA: Diagnosis not present

## 2022-09-12 DIAGNOSIS — I83892 Varicose veins of left lower extremities with other complications: Secondary | ICD-10-CM | POA: Diagnosis not present

## 2022-09-12 DIAGNOSIS — I83812 Varicose veins of left lower extremities with pain: Secondary | ICD-10-CM | POA: Diagnosis not present

## 2022-09-26 DIAGNOSIS — Z1231 Encounter for screening mammogram for malignant neoplasm of breast: Secondary | ICD-10-CM | POA: Diagnosis not present

## 2022-09-26 LAB — HM MAMMOGRAPHY

## 2022-09-27 ENCOUNTER — Other Ambulatory Visit: Payer: BC Managed Care – PPO

## 2022-09-27 DIAGNOSIS — M9901 Segmental and somatic dysfunction of cervical region: Secondary | ICD-10-CM | POA: Diagnosis not present

## 2022-09-27 DIAGNOSIS — M9905 Segmental and somatic dysfunction of pelvic region: Secondary | ICD-10-CM | POA: Diagnosis not present

## 2022-09-27 DIAGNOSIS — M9904 Segmental and somatic dysfunction of sacral region: Secondary | ICD-10-CM | POA: Diagnosis not present

## 2022-09-27 DIAGNOSIS — M5127 Other intervertebral disc displacement, lumbosacral region: Secondary | ICD-10-CM | POA: Diagnosis not present

## 2022-09-27 NOTE — Progress Notes (Unsigned)
[facility] [address] [place] [address] Phone: [phone] Subjective:   Karen Short, am serving as a scribe for Dr. Antoine Short.  I'm seeing this patient by the request  of:  Short, Karen A, DO  CC: Right hip and back pain follow-up  [id number]  Karen Short is a 57 y.o. female coming in with complaint of back and neck pain. OMT 07/30/2022. Also f/u for R hip pain. Patient states same per usual. Better since last visit. Has started cymbalta and with combo birth control feels much better. No new concerns.  Medications patient has been prescribed: None  Taking:         Reviewed prior external information including notes and imaging from previsou exam, outside providers and external EMR if available.   As well as notes that were available from care everywhere and other healthcare systems.  Past medical history, social, surgical and family history all reviewed in electronic medical record.  No pertanent information unless stated regarding to the chief complaint.   Past Medical History:  Diagnosis Date   Arthritis    Endometrial polyp    GAD (generalized anxiety disorder)    History of frequent urinary tract infections    Iron deficiency anemia    Osteopenia    Polyarthralgia    06-24-2022  per pt just tested positive RA this past friday 06-21-2022, test done by orthopedic   PONV (postoperative nausea and vomiting)    Seasonal allergies    Somatic dysfunction of back    Uterine fibroid    Wears glasses     Allergies  Allergen Reactions   Epinephrine     Lightheaded, things spin and "start to go dark", rapid HR per patient.   Latex Rash     Review of Systems:  No headache, visual changes, nausea, vomiting, diarrhea, constipation, dizziness, abdominal pain, skin rash, fevers, chills, night sweats, weight loss, swollen lymph nodes, body aches, joint swelling, chest pain, shortness of breath, mood changes.  POSITIVE muscle aches  Objective  Blood pressure 122/82, pulse 78, height 5\' 7"  (1.702 m), weight 176 lb (79.8 kg), last menstrual period 11/09/2020, SpO2 98%.   General: No apparent distress alert and oriented x3 mood and affect normal, dressed appropriately.  HEENT: Pupils equal, extraocular movements intact  Respiratory: Patient's speak in full sentences and does not appear short of breath  Cardiovascular: No lower extremity edema, non tender, no erythema  Gait relatively normal MSK:  Back exam shows the patient does have good range of motion noted but does have some more tenderness over the left sacroiliac joint.  Patient denies any radicular symptoms at the moment. Right hip exam shows  Osteopathic findings  C3 flexed rotated and side bent right C7 flexed rotated and side bent left T3 extended rotated and side bent right inhaled rib T9 extended rotated and side bent left L2 flexed rotated and side bent right L4 flexed rotated and side bent left Sacrum left on left     Assessment and Plan:  Right hip pain Patient has some more difficulty with this seems on the contralateral side at this moment.  Patient is making significant progress now and is doing well with the low-dose of the Cymbalta.  This is helping some of the rheumatoid factor as well.  Discussed icing regimen and home exercises, discussed which activities to do and which ones to avoid.  Follow-up with me again in 3 to 4 months  Nonallopathic problems  Decision today to treat with OMT was based on Physical Exam  After verbal consent patient was treated with HVLA, ME, FPR techniques in cervical, rib, thoracic, lumbar, and sacral  areas  Patient tolerated the procedure well with improvement in symptoms  Patient given exercises, stretches and lifestyle modifications  See medications in patient instructions if given  Patient will follow up in 4-8 weeks    The above documentation has been reviewed and is  accurate and complete Karen Saa, DO          Note: This dictation was prepared with Dragon dictation along with smaller phrase technology. Any transcriptional errors that result from this process are unintentional.

## 2022-09-30 DIAGNOSIS — M9901 Segmental and somatic dysfunction of cervical region: Secondary | ICD-10-CM | POA: Diagnosis not present

## 2022-09-30 DIAGNOSIS — M5127 Other intervertebral disc displacement, lumbosacral region: Secondary | ICD-10-CM | POA: Diagnosis not present

## 2022-09-30 DIAGNOSIS — M9904 Segmental and somatic dysfunction of sacral region: Secondary | ICD-10-CM | POA: Diagnosis not present

## 2022-09-30 DIAGNOSIS — M9905 Segmental and somatic dysfunction of pelvic region: Secondary | ICD-10-CM | POA: Diagnosis not present

## 2022-10-01 ENCOUNTER — Ambulatory Visit: Payer: BC Managed Care – PPO | Admitting: Family Medicine

## 2022-10-01 ENCOUNTER — Encounter: Payer: Self-pay | Admitting: Family Medicine

## 2022-10-01 VITALS — BP 122/82 | HR 78 | Ht 67.0 in | Wt 176.0 lb

## 2022-10-01 DIAGNOSIS — M9903 Segmental and somatic dysfunction of lumbar region: Secondary | ICD-10-CM | POA: Diagnosis not present

## 2022-10-01 DIAGNOSIS — M9904 Segmental and somatic dysfunction of sacral region: Secondary | ICD-10-CM | POA: Diagnosis not present

## 2022-10-01 DIAGNOSIS — M9902 Segmental and somatic dysfunction of thoracic region: Secondary | ICD-10-CM

## 2022-10-01 DIAGNOSIS — M9908 Segmental and somatic dysfunction of rib cage: Secondary | ICD-10-CM | POA: Diagnosis not present

## 2022-10-01 DIAGNOSIS — M25551 Pain in right hip: Secondary | ICD-10-CM | POA: Diagnosis not present

## 2022-10-01 DIAGNOSIS — M9901 Segmental and somatic dysfunction of cervical region: Secondary | ICD-10-CM | POA: Diagnosis not present

## 2022-10-01 NOTE — Patient Instructions (Signed)
See you again in 3 months 

## 2022-10-01 NOTE — Assessment & Plan Note (Signed)
Patient has some more difficulty with this seems on the contralateral side at this moment.  Patient is making significant progress now and is doing well with the low-dose of the Cymbalta.  This is helping some of the rheumatoid factor as well.  Discussed icing regimen and home exercises, discussed which activities to do and which ones to avoid.  Follow-up with me again in 3 to 4 months

## 2022-10-14 ENCOUNTER — Emergency Department (HOSPITAL_BASED_OUTPATIENT_CLINIC_OR_DEPARTMENT_OTHER): Payer: BC Managed Care – PPO

## 2022-10-14 ENCOUNTER — Other Ambulatory Visit: Payer: Self-pay

## 2022-10-14 ENCOUNTER — Encounter (HOSPITAL_BASED_OUTPATIENT_CLINIC_OR_DEPARTMENT_OTHER): Payer: Self-pay

## 2022-10-14 ENCOUNTER — Emergency Department (HOSPITAL_BASED_OUTPATIENT_CLINIC_OR_DEPARTMENT_OTHER)
Admission: EM | Admit: 2022-10-14 | Discharge: 2022-10-14 | Disposition: A | Discharge status: Home / Self Care | Payer: BC Managed Care – PPO | Attending: Emergency Medicine | Admitting: Emergency Medicine

## 2022-10-14 DIAGNOSIS — M79645 Pain in left finger(s): Secondary | ICD-10-CM | POA: Diagnosis not present

## 2022-10-14 DIAGNOSIS — S52125A Nondisplaced fracture of head of left radius, initial encounter for closed fracture: Secondary | ICD-10-CM

## 2022-10-14 DIAGNOSIS — R936 Abnormal findings on diagnostic imaging of limbs: Secondary | ICD-10-CM | POA: Diagnosis not present

## 2022-10-14 DIAGNOSIS — M25522 Pain in left elbow: Secondary | ICD-10-CM | POA: Diagnosis not present

## 2022-10-14 DIAGNOSIS — S62012A Displaced fracture of distal pole of navicular [scaphoid] bone of left wrist, initial encounter for closed fracture: Secondary | ICD-10-CM | POA: Diagnosis not present

## 2022-10-14 DIAGNOSIS — S62002A Unspecified fracture of navicular [scaphoid] bone of left wrist, initial encounter for closed fracture: Secondary | ICD-10-CM | POA: Diagnosis not present

## 2022-10-14 DIAGNOSIS — Y92410 Unspecified street and highway as the place of occurrence of the external cause: Secondary | ICD-10-CM | POA: Insufficient documentation

## 2022-10-14 DIAGNOSIS — S80212A Abrasion, left knee, initial encounter: Secondary | ICD-10-CM | POA: Insufficient documentation

## 2022-10-14 DIAGNOSIS — M79632 Pain in left forearm: Secondary | ICD-10-CM | POA: Diagnosis not present

## 2022-10-14 DIAGNOSIS — M79602 Pain in left arm: Secondary | ICD-10-CM | POA: Diagnosis not present

## 2022-10-14 DIAGNOSIS — Z91040 Latex allergy status: Secondary | ICD-10-CM | POA: Diagnosis not present

## 2022-10-14 MED ORDER — IBUPROFEN 400 MG PO TABS
600.0000 mg | ORAL_TABLET | Freq: Once | ORAL | Status: AC
  Administered 2022-10-14: 600 mg via ORAL
  Filled 2022-10-14: qty 1
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
## 2022-10-14 NOTE — ED Notes (Signed)
Xrays ordered per Dr Earlene Plater and ice pack applied to left arm for comfort
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
## 2022-10-14 NOTE — Discharge Instructions (Signed)
Take ibuprofen 600 mg (3 over the counter strength tablets) every 6 hours for pain and inflammation. Follow up with Dr. Katrinka Blazing for further management of the fractures of your left wrist and elbow.

## 2022-10-15 ENCOUNTER — Telehealth: Payer: Self-pay | Admitting: Family Medicine

## 2022-10-15 ENCOUNTER — Other Ambulatory Visit: Payer: Self-pay | Admitting: Family Medicine

## 2022-10-15 DIAGNOSIS — S52125A Nondisplaced fracture of head of left radius, initial encounter for closed fracture: Secondary | ICD-10-CM

## 2022-10-15 NOTE — Telephone Encounter (Signed)
Spoke with patient. Referred her to Dr. Ave Filter
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
## 2022-10-16 ENCOUNTER — Ambulatory Visit: Payer: BC Managed Care – PPO | Admitting: Family Medicine

## 2022-10-16 DIAGNOSIS — M9905 Segmental and somatic dysfunction of pelvic region: Secondary | ICD-10-CM | POA: Diagnosis not present

## 2022-10-16 DIAGNOSIS — M25532 Pain in left wrist: Secondary | ICD-10-CM | POA: Diagnosis not present

## 2022-10-16 DIAGNOSIS — S62012A Displaced fracture of distal pole of navicular [scaphoid] bone of left wrist, initial encounter for closed fracture: Secondary | ICD-10-CM | POA: Diagnosis not present

## 2022-10-16 DIAGNOSIS — M25522 Pain in left elbow: Secondary | ICD-10-CM | POA: Diagnosis not present

## 2022-10-16 DIAGNOSIS — S52136A Nondisplaced fracture of neck of unspecified radius, initial encounter for closed fracture: Secondary | ICD-10-CM | POA: Diagnosis not present

## 2022-10-16 DIAGNOSIS — M5127 Other intervertebral disc displacement, lumbosacral region: Secondary | ICD-10-CM | POA: Diagnosis not present

## 2022-10-16 DIAGNOSIS — M9904 Segmental and somatic dysfunction of sacral region: Secondary | ICD-10-CM | POA: Diagnosis not present

## 2022-10-16 DIAGNOSIS — M9901 Segmental and somatic dysfunction of cervical region: Secondary | ICD-10-CM | POA: Diagnosis not present

## 2022-10-23 DIAGNOSIS — M9901 Segmental and somatic dysfunction of cervical region: Secondary | ICD-10-CM | POA: Diagnosis not present

## 2022-10-23 DIAGNOSIS — M9904 Segmental and somatic dysfunction of sacral region: Secondary | ICD-10-CM | POA: Diagnosis not present

## 2022-10-23 DIAGNOSIS — M9905 Segmental and somatic dysfunction of pelvic region: Secondary | ICD-10-CM | POA: Diagnosis not present

## 2022-10-23 DIAGNOSIS — M5127 Other intervertebral disc displacement, lumbosacral region: Secondary | ICD-10-CM | POA: Diagnosis not present
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
## 2022-10-24 ENCOUNTER — Ambulatory Visit: Payer: BC Managed Care – PPO | Attending: Internal Medicine | Admitting: Internal Medicine

## 2022-10-24 ENCOUNTER — Encounter: Payer: Self-pay | Admitting: Internal Medicine

## 2022-10-24 VITALS — BP 123/83 | HR 79 | Resp 12 | Ht 67.0 in | Wt 179.0 lb

## 2022-10-24 DIAGNOSIS — R768 Other specified abnormal immunological findings in serum: Secondary | ICD-10-CM | POA: Diagnosis not present

## 2022-10-24 DIAGNOSIS — M67449 Ganglion, unspecified hand: Secondary | ICD-10-CM | POA: Insufficient documentation

## 2022-10-24 DIAGNOSIS — S62002A Unspecified fracture of navicular [scaphoid] bone of left wrist, initial encounter for closed fracture: Secondary | ICD-10-CM

## 2022-10-24 DIAGNOSIS — S62025D Nondisplaced fracture of middle third of navicular [scaphoid] bone of left wrist, subsequent encounter for fracture with routine healing: Secondary | ICD-10-CM | POA: Diagnosis not present

## 2022-10-24 DIAGNOSIS — S62002D Unspecified fracture of navicular [scaphoid] bone of left wrist, subsequent encounter for fracture with routine healing: Secondary | ICD-10-CM

## 2022-10-24 DIAGNOSIS — S52135D Nondisplaced fracture of neck of left radius, subsequent encounter for closed fracture with routine healing: Secondary | ICD-10-CM | POA: Diagnosis not present

## 2022-10-24 HISTORY — DX: Unspecified fracture of navicular (scaphoid) bone of left wrist, initial encounter for closed fracture: S62.002A

## 2022-10-24 NOTE — Patient Instructions (Addendum)
Your right middle finger has changes from a digital mucinous cyst these are usually caused by swelling due to osteoarthritis and can cause the associated fingernail changes.

## 2022-10-25 ENCOUNTER — Telehealth: Payer: Self-pay

## 2022-10-25 DIAGNOSIS — M9905 Segmental and somatic dysfunction of pelvic region: Secondary | ICD-10-CM | POA: Diagnosis not present

## 2022-10-25 DIAGNOSIS — M9904 Segmental and somatic dysfunction of sacral region: Secondary | ICD-10-CM | POA: Diagnosis not present

## 2022-10-25 DIAGNOSIS — M5127 Other intervertebral disc displacement, lumbosacral region: Secondary | ICD-10-CM | POA: Diagnosis not present

## 2022-10-25 DIAGNOSIS — M9901 Segmental and somatic dysfunction of cervical region: Secondary | ICD-10-CM | POA: Diagnosis not present

## 2022-10-25 NOTE — Telephone Encounter (Signed)
Transition Care Management Unsuccessful Follow-up Telephone Call  Date of discharge and from where:  [facility] 8/26  Attempts:  1st Attempt  Reason for unsuccessful TCM follow-up call:  No answer/busy   Lenard Forth [place]  [facility], [facility] Guide, Phone: [phone] Website: [email or web address]

## 2022-10-26 ENCOUNTER — Other Ambulatory Visit: Payer: Self-pay | Admitting: Family Medicine

## 2022-10-26 DIAGNOSIS — F419 Anxiety disorder, unspecified: Secondary | ICD-10-CM

## 2022-10-28 ENCOUNTER — Telehealth: Payer: Self-pay

## 2022-10-28 NOTE — Telephone Encounter (Signed)
Transition Care Management Unsuccessful Follow-up Telephone Call  Date of discharge and from where:  [facility] 8/26  Attempts:  2nd Attempt  Reason for unsuccessful TCM follow-up call:  No answer/busy   Lenard Forth [place]  [facility], [facility] Guide, Phone: [phone] Website: [email or web address]

## 2022-10-30 LAB — RNP ANTIBODY: Ribonucleic Protein(ENA) Antibody, IgG: <1 AI

## 2022-10-30 LAB — RHEUMATOID FACTOR: Rheumatoid fact SerPl-aCnc: 11 [IU]/mL (ref <14)

## 2022-10-30 LAB — SJOGRENS SYNDROME-A EXTRACTABLE NUCLEAR ANTIBODY: SSA (Ro) (ENA) Antibody, IgG: <1 AI

## 2022-10-30 LAB — C3 AND C4
C3 Complement: 106 mg/dL (ref 83–193)
C4 Complement: 24 mg/dL (ref 15–57)

## 2022-10-30 LAB — SJOGRENS SYNDROME-B EXTRACTABLE NUCLEAR ANTIBODY: SSB (La) (ENA) Antibody, IgG: <1 AI

## 2022-10-30 LAB — ANTI-DNA ANTIBODY, DOUBLE-STRANDED: ds DNA Ab: 47 [IU]/mL — ABNORMAL HIGH

## 2022-10-30 LAB — MUTATED CITRULLINATED VIMENTIN (MCV) ANTIBODY: MUTATED CITRULLINATED VIMENTIN (MCV) AB: <20 U/mL (ref <20)

## 2022-10-30 LAB — ANTI-SMITH ANTIBODY: ENA SM Ab Ser-aCnc: <1 AI

## 2022-10-31 NOTE — Progress Notes (Signed)
The rheumatoid factor was negative on repeat testing which is good. She had one antibody come back positive related to the positive ANA this was the dsDNA. This is sometimes a marker of lupus. Clinically I did not see that she has lupus so would not start treatment for this. We could follow up in about 6 months to recheck this or any new symptoms.  We can also follow up as needed about the finger cyst as discussed.

## 2022-11-06 DIAGNOSIS — M5127 Other intervertebral disc displacement, lumbosacral region: Secondary | ICD-10-CM | POA: Diagnosis not present

## 2022-11-06 DIAGNOSIS — M9905 Segmental and somatic dysfunction of pelvic region: Secondary | ICD-10-CM | POA: Diagnosis not present

## 2022-11-06 DIAGNOSIS — M9904 Segmental and somatic dysfunction of sacral region: Secondary | ICD-10-CM | POA: Diagnosis not present

## 2022-11-06 DIAGNOSIS — M9901 Segmental and somatic dysfunction of cervical region: Secondary | ICD-10-CM | POA: Diagnosis not present

## 2022-11-15 DIAGNOSIS — M9904 Segmental and somatic dysfunction of sacral region: Secondary | ICD-10-CM | POA: Diagnosis not present

## 2022-11-15 DIAGNOSIS — M9901 Segmental and somatic dysfunction of cervical region: Secondary | ICD-10-CM | POA: Diagnosis not present

## 2022-11-15 DIAGNOSIS — M5127 Other intervertebral disc displacement, lumbosacral region: Secondary | ICD-10-CM | POA: Diagnosis not present

## 2022-11-15 DIAGNOSIS — M9905 Segmental and somatic dysfunction of pelvic region: Secondary | ICD-10-CM | POA: Diagnosis not present

## 2022-11-21 DIAGNOSIS — S62025D Nondisplaced fracture of middle third of navicular [scaphoid] bone of left wrist, subsequent encounter for fracture with routine healing: Secondary | ICD-10-CM | POA: Diagnosis not present

## 2022-11-21 DIAGNOSIS — M25522 Pain in left elbow: Secondary | ICD-10-CM | POA: Diagnosis not present

## 2022-11-21 DIAGNOSIS — S52135D Nondisplaced fracture of neck of left radius, subsequent encounter for closed fracture with routine healing: Secondary | ICD-10-CM | POA: Diagnosis not present

## 2022-11-22 DIAGNOSIS — M9905 Segmental and somatic dysfunction of pelvic region: Secondary | ICD-10-CM | POA: Diagnosis not present

## 2022-11-22 DIAGNOSIS — M5127 Other intervertebral disc displacement, lumbosacral region: Secondary | ICD-10-CM | POA: Diagnosis not present

## 2022-11-22 DIAGNOSIS — M9901 Segmental and somatic dysfunction of cervical region: Secondary | ICD-10-CM | POA: Diagnosis not present

## 2022-11-22 DIAGNOSIS — M9904 Segmental and somatic dysfunction of sacral region: Secondary | ICD-10-CM | POA: Diagnosis not present

## 2022-12-03 ENCOUNTER — Ambulatory Visit
Admission: RE | Admit: 2022-12-03 | Discharge: 2022-12-03 | Disposition: A | Discharge status: Home / Self Care | Payer: BC Managed Care – PPO | Source: Ambulatory Visit | Attending: Obstetrics and Gynecology | Admitting: Obstetrics and Gynecology

## 2022-12-03 ENCOUNTER — Other Ambulatory Visit: Payer: Self-pay | Admitting: Obstetrics and Gynecology

## 2022-12-03 ENCOUNTER — Ambulatory Visit
Admission: RE | Admit: 2022-12-03 | Discharge: 2022-12-03 | Disposition: A | Discharge status: Home / Self Care | Payer: No Typology Code available for payment source | Source: Ambulatory Visit | Attending: Obstetrics and Gynecology | Admitting: Obstetrics and Gynecology

## 2022-12-03 DIAGNOSIS — R911 Solitary pulmonary nodule: Secondary | ICD-10-CM | POA: Diagnosis not present

## 2022-12-03 DIAGNOSIS — Z720 Tobacco use: Secondary | ICD-10-CM

## 2022-12-03 DIAGNOSIS — Z8249 Family history of ischemic heart disease and other diseases of the circulatory system: Secondary | ICD-10-CM

## 2022-12-03 DIAGNOSIS — I2510 Atherosclerotic heart disease of native coronary artery without angina pectoris: Secondary | ICD-10-CM | POA: Diagnosis not present

## 2022-12-04 DIAGNOSIS — M9901 Segmental and somatic dysfunction of cervical region: Secondary | ICD-10-CM | POA: Diagnosis not present

## 2022-12-04 DIAGNOSIS — M9904 Segmental and somatic dysfunction of sacral region: Secondary | ICD-10-CM | POA: Diagnosis not present

## 2022-12-04 DIAGNOSIS — M9905 Segmental and somatic dysfunction of pelvic region: Secondary | ICD-10-CM | POA: Diagnosis not present

## 2022-12-04 DIAGNOSIS — M5127 Other intervertebral disc displacement, lumbosacral region: Secondary | ICD-10-CM | POA: Diagnosis not present

## 2022-12-09 ENCOUNTER — Ambulatory Visit (INDEPENDENT_AMBULATORY_CARE_PROVIDER_SITE_OTHER): Payer: BC Managed Care – PPO | Admitting: Family Medicine

## 2022-12-09 ENCOUNTER — Other Ambulatory Visit: Payer: Self-pay

## 2022-12-09 ENCOUNTER — Encounter: Payer: Self-pay | Admitting: Family Medicine

## 2022-12-09 VITALS — BP 118/82 | HR 95 | Ht 67.0 in | Wt 179.0 lb

## 2022-12-09 DIAGNOSIS — M9908 Segmental and somatic dysfunction of rib cage: Secondary | ICD-10-CM | POA: Diagnosis not present

## 2022-12-09 DIAGNOSIS — M9901 Segmental and somatic dysfunction of cervical region: Secondary | ICD-10-CM | POA: Diagnosis not present

## 2022-12-09 DIAGNOSIS — M9904 Segmental and somatic dysfunction of sacral region: Secondary | ICD-10-CM | POA: Diagnosis not present

## 2022-12-09 DIAGNOSIS — M25532 Pain in left wrist: Secondary | ICD-10-CM | POA: Diagnosis not present

## 2022-12-09 DIAGNOSIS — M9902 Segmental and somatic dysfunction of thoracic region: Secondary | ICD-10-CM

## 2022-12-09 DIAGNOSIS — M9903 Segmental and somatic dysfunction of lumbar region: Secondary | ICD-10-CM

## 2022-12-09 DIAGNOSIS — S62002D Unspecified fracture of navicular [scaphoid] bone of left wrist, subsequent encounter for fracture with routine healing: Secondary | ICD-10-CM

## 2022-12-09 DIAGNOSIS — M25551 Pain in right hip: Secondary | ICD-10-CM

## 2022-12-09 NOTE — Patient Instructions (Addendum)
PT at [facility] Follow up with surgeon Keep next appointment

## 2022-12-09 NOTE — Assessment & Plan Note (Signed)
Seems to be multifactorial.  Discussed icing regimen and home exercises, discussed which activities to do and which ones to avoid.  Does respond extremely well though to osteopathic manipulation.  Increase activity slowly.  Follow-up again in 6 to 8 weeks

## 2022-12-09 NOTE — Progress Notes (Signed)
[name] [address] [place] [address] Phone: [phone] Subjective:   Karen Short, am serving as a scribe for Dr. Antoine Primas.  I'm seeing this patient by the request  of:  Kuneff, Renee A, DO  CC: Multiple joint complaints  [id number]  Karen Short is a 57 y.o. female coming in with complaint of back and neck pain. OMT 10/01/2022. Patient states that her wrist feels worse in the brace and with stretching.   Using the R arm a lot due to injury to the L wrist. Pain in the L cervical spine and shoulder. Lower back intermittently painful on L side.   Medications patient has been prescribed:   Taking:         Reviewed prior external information including notes and imaging from previsou exam, outside providers and external EMR if available.   As well as notes that were available from care everywhere and other healthcare systems.  Past medical history, social, surgical and family history all reviewed in electronic medical record.  No pertanent information unless stated regarding to the chief complaint.   Past Medical History:  Diagnosis Date   Arthritis    Broken bones    scaphoid and elbow   Endometrial polyp    GAD (generalized anxiety disorder)    History of frequent urinary tract infections    Iron deficiency anemia    Osteopenia    Polyarthralgia    06-24-2022  per pt just tested positive RA this past friday 06-21-2022, test done by orthopedic   PONV (postoperative nausea and vomiting)    Seasonal allergies    Somatic dysfunction of back    Uterine fibroid    Wears glasses     Allergies  Allergen Reactions   Epinephrine     Lightheaded, things spin and "start to go dark", rapid HR per patient.   Latex Rash     Review of Systems:  No headache, visual changes, nausea, vomiting, diarrhea, constipation, dizziness, abdominal pain, skin rash, fevers, chills, night sweats, weight loss, swollen lymph  nodes, body aches, joint swelling, chest pain, shortness of breath, mood changes. POSITIVE muscle aches  Objective  Blood pressure 118/82, pulse 95, height 5\' 7"  (1.702 m), weight 179 lb (81.2 kg), SpO2 96%.   General: No apparent distress alert and oriented x3 mood and affect normal, dressed appropriately.  HEENT: Pupils equal, extraocular movements intact  Respiratory: Patient's speak in full sentences and does not appear short of breath  Cardiovascular: No lower extremity edema, non tender, no erythema  Patient is a thumb does have atrophy noted of the forearm and the wrist fairly significantly.  Patient does have good range of motion at the elbow.  Patient does have though significant stiffness noted of the left wrist.  Patient does have thenar eminence wasting noted.  Osteopathic findings  C7 flexed rotated and side bent right T3 extended rotated and side bent right inhaled rib T7 extended rotated and side bent left L2 flexed rotated and side bent right Sacrum right on right  Limited muscular skeletal ultrasound was performed and interpreted by Antoine Primas, M  Limited ultrasound shows the patient does have good callus formation over the scaphoid bone.  Seems to be more of a hard callus at this moment.  I do not see a cortical irregularity at the moment.   Assessment and Plan:  Closed nondisplaced fracture of scaphoid of left wrist Does have a fracture noted.  Relatively good callus  formation noted over the area.  Patient though is only approximately about 8 weeks out from the injury.  Patient is to follow-up with the surgeon for full release but will start the referral to physical therapy/Occupational Therapy.  Do think it would be beneficial to start getting range of motion with patient significant atrophy noted.  Neurovascularly intact it appears.  Right hip pain Seems to be multifactorial.  Discussed icing regimen and home exercises, discussed which activities to do and which  ones to avoid.  Does respond extremely well though to osteopathic manipulation.  Increase activity slowly.  Follow-up again in 6 to 8 weeks    Nonallopathic problems  Decision today to treat with OMT was based on Physical Exam  After verbal consent patient was treated with HVLA, ME, FPR techniques in cervical, rib, thoracic, lumbar, and sacral  areas  Patient tolerated the procedure well with improvement in symptoms  Patient given exercises, stretches and lifestyle modifications  See medications in patient instructions if given  Patient will follow up in 4-8 weeks     The above documentation has been reviewed and is accurate and complete Judi Saa, DO         Note: This dictation was prepared with Dragon dictation along with smaller phrase technology. Any transcriptional errors that result from this process are unintentional.

## 2022-12-09 NOTE — Assessment & Plan Note (Addendum)
Does have a fracture noted.  Relatively good callus formation noted over the area.  Patient though is only approximately about 8 weeks out from the injury.  Patient is to follow-up with the surgeon for full release but will start the referral to physical therapy/Occupational Therapy.  Do think it would be beneficial to start getting range of motion with patient significant atrophy noted.  Neurovascularly intact it appears.  Was concern as well as secondary to the thenar eminence wasting that we will need to be monitored closely.  Patient still has follow-up to be fully released by other physician in 2 weeks

## 2022-12-16 ENCOUNTER — Ambulatory Visit: Payer: BC Managed Care – PPO | Admitting: Physical Therapy

## 2022-12-16 DIAGNOSIS — M25522 Pain in left elbow: Secondary | ICD-10-CM

## 2022-12-16 DIAGNOSIS — M6281 Muscle weakness (generalized): Secondary | ICD-10-CM

## 2022-12-16 DIAGNOSIS — M25532 Pain in left wrist: Secondary | ICD-10-CM | POA: Diagnosis not present
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
## 2022-12-17 ENCOUNTER — Encounter: Payer: Self-pay | Admitting: Physical Therapy

## 2022-12-18 DIAGNOSIS — M9901 Segmental and somatic dysfunction of cervical region: Secondary | ICD-10-CM | POA: Diagnosis not present

## 2022-12-18 DIAGNOSIS — M5127 Other intervertebral disc displacement, lumbosacral region: Secondary | ICD-10-CM | POA: Diagnosis not present

## 2022-12-18 DIAGNOSIS — M9905 Segmental and somatic dysfunction of pelvic region: Secondary | ICD-10-CM | POA: Diagnosis not present

## 2022-12-18 DIAGNOSIS — M9904 Segmental and somatic dysfunction of sacral region: Secondary | ICD-10-CM | POA: Diagnosis not present

## 2022-12-26 DIAGNOSIS — S52135D Nondisplaced fracture of neck of left radius, subsequent encounter for closed fracture with routine healing: Secondary | ICD-10-CM | POA: Diagnosis not present

## 2022-12-26 DIAGNOSIS — S62025D Nondisplaced fracture of middle third of navicular [scaphoid] bone of left wrist, subsequent encounter for fracture with routine healing: Secondary | ICD-10-CM | POA: Diagnosis not present

## 2022-12-27 DIAGNOSIS — M9904 Segmental and somatic dysfunction of sacral region: Secondary | ICD-10-CM | POA: Diagnosis not present

## 2022-12-27 DIAGNOSIS — M9901 Segmental and somatic dysfunction of cervical region: Secondary | ICD-10-CM | POA: Diagnosis not present

## 2022-12-27 DIAGNOSIS — M9905 Segmental and somatic dysfunction of pelvic region: Secondary | ICD-10-CM | POA: Diagnosis not present

## 2022-12-27 DIAGNOSIS — M5127 Other intervertebral disc displacement, lumbosacral region: Secondary | ICD-10-CM | POA: Diagnosis not present

## 2022-12-27 NOTE — Progress Notes (Unsigned)
[name] [address] [place] [address] Phone: [phone] Subjective:   INadine Counts, am serving as a scribe for Dr. Antoine Primas.  I'm seeing this patient by the request  of:  Kuneff, Renee A, DO  CC: left wrist and right hip pain   NUU:[id number]  12/09/2022 Does have a fracture noted. Relatively good callus formation noted over the area. Patient though is only approximately about 8 weeks out from the injury. Patient is to follow-up with the surgeon for full release but will start the referral to physical therapy/Occupational Therapy. Do think it would be beneficial to start getting range of motion with patient significant atrophy noted. Neurovascularly intact it appears. Was concern as well as secondary to the thenar eminence wasting that we will need to be monitored closely. Patient still has follow-up to be fully released by other physician in 2 weeks   Seems to be multifactorial.  Discussed icing regimen and home exercises, discussed which activities to do and which ones to avoid.  Does respond extremely well though to osteopathic manipulation.  Increase activity slowly.  Follow-up again in 6 to 8 weeks      Update 12/31/2022 Karen Short is a 57 y.o. female coming in with complaint of L wrist and R hip pain. Patient states started PT yesterday. Hip is doing great. Wants OMT.  Left wrist seems to be doing relatively well.  Has improvement in range of motion.      Past Medical History:  Diagnosis Date   Arthritis    Broken bones    scaphoid and elbow   Endometrial polyp    GAD (generalized anxiety disorder)    History of frequent urinary tract infections    Iron deficiency anemia    Osteopenia    Polyarthralgia    06-24-2022  per pt just tested positive RA this past friday 06-21-2022, test done by orthopedic   PONV (postoperative nausea and vomiting)    Seasonal allergies    Somatic dysfunction of back    Uterine  fibroid    Wears glasses    Past Surgical History:  Procedure Laterality Date   BREAST LUMPECTOMY Left 2008   benign;2018/2022/2023   COLONOSCOPY  2019   DILATATION & CURETTAGE/HYSTEROSCOPY WITH MYOSURE N/A 06/28/2022   Procedure: DILATATION & CURETTAGE/HYSTEROSCOPY WITH MYOSURE;  Surgeon: Richardean Chimera, MD;  Location: [facility];  Service: Gynecology;  Laterality: N/A;   HYSTEROSCOPY WITH D & C  09/2021   06/2022   KNEE ARTHROSCOPY W/ MENISCAL REPAIR Right 2014   LAPAROSCOPIC GELPORT ASSISTED MYOMECTOMY  2011   TONSILLECTOMY     child-1973   Social History   Socioeconomic History   Marital status: Legally Separated    Spouse name: Not on file   Number of children: Not on file   Years of education: Not on file   Highest education level: Master's degree (e.g., MA, MS, MEng, MEd, MSW, MBA)  Occupational History   Not on file  Tobacco Use   Smoking status: Former    Current packs/day: 0.00    Types: Cigarettes    Start date: 42    Quit date: 1987    Years since quitting: 37.8    Passive exposure: Past   Smokeless tobacco: Never  Vaping Use   Vaping status: Never Used  Substance and Sexual Activity   Alcohol use: Yes    Alcohol/week: 4.0 - 5.0 standard drinks of alcohol    Types: 4 -  5 Standard drinks or equivalent per week   Drug use: Never   Sexual activity: Not Currently    Partners: Female    Birth control/protection: None, Post-menopausal  Other Topics Concern   Not on file  Social History Narrative   Marital status/children/pets: Divorced, G0P0   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   Safety:         -smoke alarm in the home:Yes     - wears seatbelt: Yes     - Feels safe in their relationships: Yes      Social Determinants of Health   Financial Resource Strain: Low Risk  (08/07/2021)   Overall Financial Resource Strain (CARDIA)    Difficulty of Paying Living Expenses: Not hard at all  Food Insecurity: No Food Insecurity (08/07/2021)    Hunger Vital Sign    Worried About Running Out of Food in the Last Year: Never true    Ran Out of Food in the Last Year: Never true  Transportation Needs: No Transportation Needs (08/07/2021)   PRAPARE - Administrator, Civil Service (Medical): No    Lack of Transportation (Non-Medical): No  Physical Activity: Sufficiently Active (08/07/2021)   Exercise Vital Sign    Days of Exercise per Week: 5 days    Minutes of Exercise per Session: 40 min  Stress: Stress Concern Present (08/07/2021)   [employer] of Occupational Health - Occupational Stress Questionnaire    Feeling of Stress : To some extent  Social Connections: Socially Isolated (08/07/2021)   Social Connection and Isolation Panel [NHANES]    Frequency of Communication with Friends and Family: Once a week    Frequency of Social Gatherings with Friends and Family: Once a week    Attends Religious Services: Never    Database administrator or Organizations: No    Attends Engineer, structural: Not on file    Marital Status: Separated   Allergies  Allergen Reactions   Epinephrine     Lightheaded, things spin and "start to go dark", rapid HR per patient.   Latex Rash   Family History  Problem Relation Age of Onset   Kidney disease Mother    Miscarriages / [place] Mother    COPD Mother    Cancer Mother        skin   Arthritis Mother    Hyperlipidemia Mother    Heart disease Mother    Stroke Mother    Hypertension Mother    Diabetes Mother    Heart attack Mother    Heart attack Father    Hearing loss Father    Cancer Father        skin   Arthritis Father    Heart disease Father    Stroke Father    Hypertension Father    Asthma Brother    Hearing loss Brother    Arthritis Brother    Breast cancer Maternal Aunt    Breast cancer Paternal Aunt    Colon polyps Neg Hx    Colon cancer Neg Hx    Esophageal cancer Neg Hx    Rectal cancer Neg Hx    Stomach cancer Neg Hx     Current  Outpatient Medications (Endocrine & Metabolic):    LO LOESTRIN FE 1 MG-10 MCG / 10 MCG tablet, Take 1 tablet by mouth daily.   Current Outpatient Medications (Respiratory):    fluticasone (FLONASE) 50 MCG/ACT nasal spray, SPRAY 1 SPRAY INTO EACH NOSTRIL TWICE A DAY AS NEEDED (Patient  taking differently: Place 1 spray into both nostrils daily as needed.)   Current Outpatient Medications (Hematological):    L-glutamine (ENDARI) 5 g PACK Powder Packet, Take by mouth 2 (two) times daily.  Current Outpatient Medications (Other):    Azelaic Acid (FINACEA) 15 % gel, Apply topically 2 (two) times daily. After skin is thoroughly washed and patted dry, gently but thoroughly massage a thin film of azelaic acid cream into the affected area twice daily, in the morning and evening.   DULoxetine (CYMBALTA) 20 MG capsule, Take 1 capsule (20 mg total) by mouth daily.   Ketotifen Fumarate (ALLERGY EYE DROPS OP), Apply to eye as needed.   Multiple Vitamin (DAILY VITAMIN) tablet, Take 1 tablet by mouth daily. Take 1 tablet by mouth.   VITAMIN D-VITAMIN K PO, Take by mouth. 4000/100   Reviewed prior external information including notes and imaging from  primary care provider As well as notes that were available from care everywhere and other healthcare systems.  Past medical history, social, surgical and family history all reviewed in electronic medical record.  No pertanent information unless stated regarding to the chief complaint.   Review of Systems:  No headache, visual changes, nausea, vomiting, diarrhea, constipation, dizziness, abdominal pain, skin rash, fevers, chills, night sweats, weight loss, swollen lymph nodes, body aches, joint swelling, chest pain, shortness of breath, mood changes. POSITIVE muscle aches  Objective  Blood pressure 118/82, pulse 83, height 5\' 7"  (1.702 m), weight 176 lb (79.8 kg), SpO2 98%.   General: No apparent distress alert and oriented x3 mood and affect normal, dressed  appropriately.  HEENT: Pupils equal, extraocular movements intact  Respiratory: Patient's speak in full sentences and does not appear short of breath  Cardiovascular: No lower extremity edema, non tender, no erythema  Left wrist exam shows does have improvement in range of motion hand exam shows the patient does have a calcific change noted over the DIP  Right hip exam shows tightness with FABER but nontender over the greater trochanteric area  Limited muscular skeletal ultrasound was performed and interpreted by Antoine Primas, M   Limited ultrasound does show some hypoechoic changes in the DIP but does seem to be more of a soft tissue calcific change.   Osteopathic findings C2 flexed rotated and side bent right C4 flexed rotated and side bent left C6 flexed rotated and side bent left T3 extended rotated and side bent right inhaled third rib T8 extended rotated and side bent left L1 flexed rotated and side bent right Sacrum right on right    Impression and Recommendations:    Left thumb sprain Patient over the PIP does have some calcific changes noted.  Discussed with patient about which activities to do and which ones to avoid.  Follow-up again otherwise in 6 to 8 weeks.  Polyarthralgia Patient is responding well to osteopathic manipulation with this.  Discussed which activities to do and which ones to avoid.  Increase activity slowly.  Discussed icing regimen.  Follow-up again in 6 to 8 weeks     Decision today to treat with OMT was based on Physical Exam  After verbal consent patient was treated with HVLA, ME, FPR techniques in cervical, thoracic, rib, lumbar and sacral areas, all areas are chronic   Patient tolerated the procedure well with improvement in symptoms  Patient given exercises, stretches and lifestyle modifications  See medications in patient instructions if given  Patient will follow up in 4-8 weeks.    The above documentation  has been reviewed and is  accurate and complete Judi Saa, DO

## 2022-12-30 ENCOUNTER — Ambulatory Visit: Payer: BC Managed Care – PPO | Admitting: Physical Therapy

## 2022-12-30 DIAGNOSIS — M25532 Pain in left wrist: Secondary | ICD-10-CM

## 2022-12-30 DIAGNOSIS — M6281 Muscle weakness (generalized): Secondary | ICD-10-CM | POA: Diagnosis not present

## 2022-12-30 DIAGNOSIS — M25522 Pain in left elbow: Secondary | ICD-10-CM | POA: Diagnosis not present
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
## 2022-12-31 ENCOUNTER — Ambulatory Visit: Payer: BC Managed Care – PPO | Admitting: Family Medicine

## 2022-12-31 ENCOUNTER — Encounter: Payer: Self-pay | Admitting: Family Medicine

## 2022-12-31 ENCOUNTER — Other Ambulatory Visit: Payer: Self-pay

## 2022-12-31 ENCOUNTER — Encounter: Payer: Self-pay | Admitting: Physical Therapy

## 2022-12-31 VITALS — BP 118/82 | HR 83 | Ht 67.0 in | Wt 176.0 lb

## 2022-12-31 DIAGNOSIS — S63622A Sprain of interphalangeal joint of left thumb, initial encounter: Secondary | ICD-10-CM

## 2022-12-31 DIAGNOSIS — M9908 Segmental and somatic dysfunction of rib cage: Secondary | ICD-10-CM

## 2022-12-31 DIAGNOSIS — M9904 Segmental and somatic dysfunction of sacral region: Secondary | ICD-10-CM

## 2022-12-31 DIAGNOSIS — M2550 Pain in unspecified joint: Secondary | ICD-10-CM | POA: Diagnosis not present

## 2022-12-31 DIAGNOSIS — M25532 Pain in left wrist: Secondary | ICD-10-CM | POA: Diagnosis not present

## 2022-12-31 DIAGNOSIS — S63602A Unspecified sprain of left thumb, initial encounter: Secondary | ICD-10-CM | POA: Insufficient documentation

## 2022-12-31 DIAGNOSIS — M9903 Segmental and somatic dysfunction of lumbar region: Secondary | ICD-10-CM

## 2022-12-31 DIAGNOSIS — M9901 Segmental and somatic dysfunction of cervical region: Secondary | ICD-10-CM

## 2022-12-31 DIAGNOSIS — M9902 Segmental and somatic dysfunction of thoracic region: Secondary | ICD-10-CM

## 2022-12-31 NOTE — Patient Instructions (Signed)
Happy Holidays Good to see you! Good luck with the brothers Shockwave on thumb (setup appointment) See you again in 6-8 weeks
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
## 2022-12-31 NOTE — Assessment & Plan Note (Signed)
Patient over the PIP does have some calcific changes noted.  Discussed with patient about which activities to do and which ones to avoid.  Follow-up again otherwise in 6 to 8 weeks.

## 2022-12-31 NOTE — Assessment & Plan Note (Signed)
Patient is responding well to osteopathic manipulation with this.  Discussed which activities to do and which ones to avoid.  Increase activity slowly.  Discussed icing regimen.  Follow-up again in 6 to 8 weeks

## 2023-01-01 ENCOUNTER — Ambulatory Visit: Payer: BC Managed Care – PPO | Admitting: Family Medicine

## 2023-01-01 VITALS — BP 118/78 | HR 79 | Ht 67.0 in | Wt 176.0 lb

## 2023-01-01 DIAGNOSIS — M79645 Pain in left finger(s): Secondary | ICD-10-CM | POA: Diagnosis not present

## 2023-01-01 DIAGNOSIS — G8929 Other chronic pain: Secondary | ICD-10-CM

## 2023-01-01 NOTE — Patient Instructions (Addendum)
Thank you for coming in today.   Please use Voltaren gel (Generic Diclofenac Gel) up to 4x daily for pain as needed.  This is available over-the-counter as both the name brand Voltaren gel and the generic diclofenac gel.   Schedule another shockwave treatment for next week.

## 2023-01-03 ENCOUNTER — Other Ambulatory Visit: Payer: Self-pay | Admitting: Obstetrics and Gynecology

## 2023-01-03 DIAGNOSIS — N649 Disorder of breast, unspecified: Secondary | ICD-10-CM

## 2023-01-06 ENCOUNTER — Ambulatory Visit: Payer: BC Managed Care – PPO | Admitting: Physical Therapy

## 2023-01-06 ENCOUNTER — Encounter: Payer: Self-pay | Admitting: Physical Therapy

## 2023-01-06 DIAGNOSIS — M6281 Muscle weakness (generalized): Secondary | ICD-10-CM

## 2023-01-06 DIAGNOSIS — M25522 Pain in left elbow: Secondary | ICD-10-CM

## 2023-01-06 DIAGNOSIS — M25532 Pain in left wrist: Secondary | ICD-10-CM
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
## 2023-01-09 ENCOUNTER — Ambulatory Visit: Payer: Self-pay | Admitting: Family Medicine

## 2023-01-09 DIAGNOSIS — G8929 Other chronic pain: Secondary | ICD-10-CM

## 2023-01-09 DIAGNOSIS — M79645 Pain in left finger(s): Secondary | ICD-10-CM

## 2023-01-09 NOTE — Progress Notes (Signed)
   [facility] [address] [place] [address] Phone: [phone]   Extracorporeal Shockwave Therapy Note    Patient is being treated today with ECSWT. Informed consent was obtained and patient tolerated procedure well.   Therapy performed by Clementeen Graham  Condition treated: Calcific tinnitus right thumb Treatment preset used: Modification low power setting Energy used: 10 mJ Frequency used: 14 Hz Number of pulses: 2000 Head Size: Large Treatment #2 of #4  Electronically signed by:  [facility] 8:40 AM 01/09/23

## 2023-01-10 DIAGNOSIS — M5127 Other intervertebral disc displacement, lumbosacral region: Secondary | ICD-10-CM | POA: Diagnosis not present

## 2023-01-10 DIAGNOSIS — M9905 Segmental and somatic dysfunction of pelvic region: Secondary | ICD-10-CM | POA: Diagnosis not present

## 2023-01-10 DIAGNOSIS — M9901 Segmental and somatic dysfunction of cervical region: Secondary | ICD-10-CM | POA: Diagnosis not present

## 2023-01-10 DIAGNOSIS — M9904 Segmental and somatic dysfunction of sacral region: Secondary | ICD-10-CM | POA: Diagnosis not present

## 2023-01-17 DIAGNOSIS — M5127 Other intervertebral disc displacement, lumbosacral region: Secondary | ICD-10-CM | POA: Diagnosis not present

## 2023-01-17 DIAGNOSIS — M9904 Segmental and somatic dysfunction of sacral region: Secondary | ICD-10-CM | POA: Diagnosis not present

## 2023-01-17 DIAGNOSIS — M9905 Segmental and somatic dysfunction of pelvic region: Secondary | ICD-10-CM | POA: Diagnosis not present

## 2023-01-17 DIAGNOSIS — M9901 Segmental and somatic dysfunction of cervical region: Secondary | ICD-10-CM | POA: Diagnosis not present

## 2023-01-20 ENCOUNTER — Ambulatory Visit: Payer: BC Managed Care – PPO | Admitting: Family Medicine

## 2023-01-20 ENCOUNTER — Encounter: Payer: Self-pay | Admitting: Physical Therapy

## 2023-01-20 ENCOUNTER — Ambulatory Visit: Payer: BC Managed Care – PPO | Admitting: Physical Therapy

## 2023-01-20 DIAGNOSIS — M25522 Pain in left elbow: Secondary | ICD-10-CM | POA: Diagnosis not present

## 2023-01-20 DIAGNOSIS — G8929 Other chronic pain: Secondary | ICD-10-CM

## 2023-01-20 DIAGNOSIS — M79645 Pain in left finger(s): Secondary | ICD-10-CM

## 2023-01-20 DIAGNOSIS — M9901 Segmental and somatic dysfunction of cervical region: Secondary | ICD-10-CM | POA: Diagnosis not present

## 2023-01-20 DIAGNOSIS — M9905 Segmental and somatic dysfunction of pelvic region: Secondary | ICD-10-CM | POA: Diagnosis not present

## 2023-01-20 DIAGNOSIS — M25532 Pain in left wrist: Secondary | ICD-10-CM | POA: Diagnosis not present

## 2023-01-20 DIAGNOSIS — M5127 Other intervertebral disc displacement, lumbosacral region: Secondary | ICD-10-CM | POA: Diagnosis not present

## 2023-01-20 DIAGNOSIS — M6281 Muscle weakness (generalized): Secondary | ICD-10-CM | POA: Diagnosis not present

## 2023-01-20 DIAGNOSIS — M9904 Segmental and somatic dysfunction of sacral region: Secondary | ICD-10-CM | POA: Diagnosis not present
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
## 2023-01-20 NOTE — Patient Instructions (Addendum)
Thank you for coming in today.   Anshul "Trevor Mace, MD Specialties and/or Subspecialties Orthopedic Surgery, Hand & Wrist   Dr Frazier Butt, Dr Orlan Leavens and Dr Amanda Pea at [facility]  See what Dr Janee Morn says. Easy to add more.

## 2023-01-20 NOTE — Progress Notes (Signed)
   [facility] [address] [place] [address] Phone: [phone]   Extracorporeal Shockwave Therapy Note    Patient is being treated today with ECSWT. Informed consent was obtained and patient tolerated procedure well.   Therapy performed by Clementeen Graham  Condition treated: Left thumb pain Treatment preset used: Custom low power settings Energy used: 10 mJ Frequency used: 14 Hz Number of pulses: 2000 Head Size: Large Treatment #3 of #3-4  Electronically signed by:  [facility] 8:32 AM 01/20/23 She will follow-up with her hand surgeon later this week.  Provided the names of other hand surgeons if needed.  A second opinion with a hand surgeon is a good idea.  She does have some thenar atrophy developing.

## 2023-01-22 DIAGNOSIS — C4401 Basal cell carcinoma of skin of lip: Secondary | ICD-10-CM | POA: Diagnosis not present

## 2023-01-27 ENCOUNTER — Encounter: Payer: BC Managed Care – PPO | Admitting: Physical Therapy

## 2023-01-27 ENCOUNTER — Encounter: Payer: Self-pay | Admitting: Family Medicine

## 2023-01-27 ENCOUNTER — Ambulatory Visit: Payer: BC Managed Care – PPO | Admitting: Family Medicine

## 2023-01-27 VITALS — BP 138/88 | HR 77 | Temp 97.6°F | Ht 67.0 in | Wt 172.6 lb

## 2023-01-27 DIAGNOSIS — Z13220 Encounter for screening for lipoid disorders: Secondary | ICD-10-CM | POA: Diagnosis not present

## 2023-01-27 DIAGNOSIS — F419 Anxiety disorder, unspecified: Secondary | ICD-10-CM

## 2023-01-27 DIAGNOSIS — Z1231 Encounter for screening mammogram for malignant neoplasm of breast: Secondary | ICD-10-CM | POA: Diagnosis not present

## 2023-01-27 DIAGNOSIS — Z0000 Encounter for general adult medical examination without abnormal findings: Secondary | ICD-10-CM

## 2023-01-27 DIAGNOSIS — Z131 Encounter for screening for diabetes mellitus: Secondary | ICD-10-CM | POA: Diagnosis not present

## 2023-01-27 DIAGNOSIS — Z823 Family history of stroke: Secondary | ICD-10-CM

## 2023-01-27 DIAGNOSIS — Z23 Encounter for immunization: Secondary | ICD-10-CM

## 2023-01-27 DIAGNOSIS — Z1211 Encounter for screening for malignant neoplasm of colon: Secondary | ICD-10-CM

## 2023-01-27 DIAGNOSIS — M2550 Pain in unspecified joint: Secondary | ICD-10-CM

## 2023-01-27 DIAGNOSIS — E663 Overweight: Secondary | ICD-10-CM

## 2023-01-27 DIAGNOSIS — Z8249 Family history of ischemic heart disease and other diseases of the circulatory system: Secondary | ICD-10-CM

## 2023-01-27 DIAGNOSIS — F411 Generalized anxiety disorder: Secondary | ICD-10-CM

## 2023-01-27 DIAGNOSIS — M8580 Other specified disorders of bone density and structure, unspecified site: Secondary | ICD-10-CM

## 2023-01-27 LAB — COMPREHENSIVE METABOLIC PANEL
ALT: 25 U/L (ref 0–35)
AST: 31 U/L (ref 0–37)
Albumin: 4 g/dL (ref 3.5–5.2)
Alkaline Phosphatase: 47 U/L (ref 39–117)
BUN: 9 mg/dL (ref 6–23)
CO2: 28 meq/L (ref 19–32)
Calcium: 8.9 mg/dL (ref 8.4–10.5)
Chloride: 100 meq/L (ref 96–112)
Creatinine, Ser: 0.79 mg/dL (ref 0.40–1.20)
GFR: 83.47 mL/min (ref >60.00)
Glucose, Bld: 80 mg/dL (ref 70–99)
Potassium: 4.1 meq/L (ref 3.5–5.1)
Sodium: 135 meq/L (ref 135–145)
Total Bilirubin: 0.5 mg/dL (ref 0.2–1.2)
Total Protein: 7.1 g/dL (ref 6.0–8.3)

## 2023-01-27 LAB — CBC
HCT: 41.4 % (ref 36.0–46.0)
Hemoglobin: 13.7 g/dL (ref 12.0–15.0)
MCHC: 33 g/dL (ref 30.0–36.0)
MCV: 97.2 fL (ref 78.0–100.0)
Platelets: 231 10*3/uL (ref 150.0–400.0)
RBC: 4.26 Mil/uL (ref 3.87–5.11)
RDW: 12.9 % (ref 11.5–15.5)
WBC: 2.9 10*3/uL — ABNORMAL LOW (ref 4.0–10.5)

## 2023-01-27 LAB — LIPID PANEL
Cholesterol: 168 mg/dL (ref 0–200)
HDL: 66.3 mg/dL (ref >39.00)
LDL Cholesterol: 86 mg/dL (ref 0–99)
NonHDL: 101.4
Total CHOL/HDL Ratio: 3
Triglycerides: 78 mg/dL (ref 0.0–149.0)
VLDL: 15.6 mg/dL (ref 0.0–40.0)

## 2023-01-27 LAB — HEMOGLOBIN A1C: Hgb A1c MFr Bld: 5.4 % (ref 4.6–6.5)

## 2023-01-27 LAB — TSH: TSH: 5.66 u[IU]/mL — ABNORMAL HIGH (ref 0.35–5.50)

## 2023-01-27 MED ORDER — DULOXETINE HCL 20 MG PO CPEP: 20.0000 mg | ORAL_CAPSULE | Freq: Every day | ORAL | 1 refills | Status: DC

## 2023-01-27 NOTE — Progress Notes (Signed)
Patient ID: Karen Short, female  DOB: 1965-10-15, 57 y.o.   MRN: [id number] Patient Care Team    Relationship Specialty Notifications Start End  Natalia Leatherwood, DO PCP - General Family Medicine  07/23/22   Richardean Chimera, MD Consulting Physician Obstetrics and Gynecology  07/25/17   Beryle Flock, PT Physical Therapist Physical Therapy  08/17/19   Elise Benne, MD Consulting Physician Ophthalmology  07/18/22   Fuller Plan, MD Consulting Physician Rheumatology  07/18/22   Judi Saa, DO Consulting Physician Family Medicine  07/18/22   Carman Ching, MD (Inactive) Consulting Physician Gastroenterology  07/23/22     Chief Complaint  Patient presents with   Annual Exam    Pt is fasting; pap and mam requested    Subjective: Karen Short is a 57 y.o.  female present for cpe/CMC All past medical history, surgical history, allergies, family history, immunizations, medications and social history were updated in the electronic medical record today. All recent labs, ED visits and hospitalizations within the last year were reviewed. Health maintenance:  Colonoscopy:03/2017- 10 yr, Dr. Doristine Johns Mammogram:10/16/2021- gyn- requested Cervical cancer screening:08/2022- requested Immunizations: tdap UTD 2023, shingrix (schedule nurse appt), influenza UTD Infectious disease screening: hiv/hep completed  Anxiety disorder/hot flashes/polyarthralgia: Loves Cymbalta. Doing great.  Prior note: Patient's hot flashes have not slowed down, but she is now getting her menstrual period again.  It has been consistent with dysfunctional uterine bleeding over the last 6 months.  She has a gynecological appointment coming up within the next couple weeks.  She has a history of ANA positive, rheumatoid factor positive arthritis and has polyarthralgia and hip pain.     07/23/2022    2:19 PM 01/25/2022    8:04 AM 08/10/2021    1:13 PM 12/29/2020   11:27 AM 08/31/2018    2:12 PM  Depression  screen PHQ 2/9  Decreased Interest 0 0 0 0 0  Down, Depressed, Hopeless 0 0 0 0 0  PHQ - 2 Score 0 0 0 0 0  Altered sleeping  0 0    Tired, decreased energy  1 1    Change in appetite  0 0    Feeling bad or failure about yourself   0 0    Trouble concentrating  0 0    Moving slowly or fidgety/restless  0 0    Suicidal thoughts  0 0    PHQ-9 Score  1 1    Difficult doing work/chores  Not difficult at all Not difficult at all         No data to display                 07/23/2022    2:19 PM 01/25/2022    8:04 AM 08/10/2021    1:13 PM  Fall Risk   Falls in the past year? 0 0 0  Number falls in past yr: 0 0 0  Injury with Fall? 0 0 0  Risk for fall due to : No Fall Risks No Fall Risks No Fall Risks  Follow up Falls evaluation completed Falls evaluation completed Falls evaluation completed    Immunization History  Administered Date(s) Administered   Influenza Inj Mdck Quad Pf 12/18/2016, 10/28/2021   Influenza, Mdck, Trivalent,PF 6+ MOS(egg free) 12/23/2022   Influenza, Seasonal, Injecte, Preservative Fre 12/18/2016   Influenza,inj,Quad PF,6+ Mos 01/10/2018, 12/21/2019   Influenza,inj,quad, With Preservative 12/18/2016   Influenza-Unspecified 11/24/2020   PFIZER Comirnaty(Gray Top)Covid-19 Tri-Sucrose Vaccine  11/02/2021   PFIZER(Purple Top)SARS-COV-2 Vaccination 05/07/2019, 05/28/2019, 11/14/2020   Tdap 01/25/2022    No results found.  Past Medical History:  Diagnosis Date   Arthritis    Broken bones    scaphoid and elbow   Endometrial polyp    GAD (generalized anxiety disorder)    History of frequent urinary tract infections    Iron deficiency anemia    Osteopenia    Polyarthralgia    06-24-2022  per pt just tested positive RA this past friday 06-21-2022, test done by orthopedic   PONV (postoperative nausea and vomiting)    Seasonal allergies    Somatic dysfunction of back    Uterine fibroid    Wears glasses    Allergies  Allergen Reactions   Epinephrine      Lightheaded, things spin and "start to go dark", rapid HR per patient.   Latex Rash   Past Surgical History:  Procedure Laterality Date   BREAST LUMPECTOMY Left 2008   benign;2018/2022/2023   COLONOSCOPY  2019   DILATATION & CURETTAGE/HYSTEROSCOPY WITH MYOSURE N/A 06/28/2022   Procedure: DILATATION & CURETTAGE/HYSTEROSCOPY WITH MYOSURE;  Surgeon: Richardean Chimera, MD;  Location: [facility] [place];  Service: Gynecology;  Laterality: N/A;   HYSTEROSCOPY WITH D & C  09/2021   06/2022   KNEE ARTHROSCOPY W/ MENISCAL REPAIR Right 2014   LAPAROSCOPIC GELPORT ASSISTED MYOMECTOMY  2011   TONSILLECTOMY     child-1973   Family History  Problem Relation Age of Onset   Kidney disease Mother    Miscarriages / [place] Mother    COPD Mother    Cancer Mother        skin   Arthritis Mother    Hyperlipidemia Mother    Heart disease Mother    Stroke Mother    Hypertension Mother    Diabetes Mother    Heart attack Mother    Heart attack Father    Hearing loss Father    Cancer Father        skin   Arthritis Father    Heart disease Father    Stroke Father    Hypertension Father    Asthma Brother    Hearing loss Brother    Arthritis Brother    Breast cancer Maternal Aunt    Breast cancer Paternal Aunt    Colon polyps Neg Hx    Colon cancer Neg Hx    Esophageal cancer Neg Hx    Rectal cancer Neg Hx    Stomach cancer Neg Hx    Social History   Social History Narrative   Marital status/children/pets: Divorced, G0P0   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   Safety:         -smoke alarm in the home:Yes     - wears seatbelt: Yes     - Feels safe in their relationships: Yes       Allergies as of 01/27/2023       Reactions   Epinephrine    Lightheaded, things spin and "start to go dark", rapid HR per patient.   Latex Rash        Medication List        Accurate as of January 27, 2023  9:24 AM. If you have any questions, ask your nurse or doctor.           STOP taking these medications    ALLERGY EYE DROPS OP Stopped by: Felix Pacini       TAKE these medications  Daily Vitamin tablet Take 1 tablet by mouth daily. Take 1 tablet by mouth.   DULoxetine 20 MG capsule Commonly known as: Cymbalta Take 1 capsule (20 mg total) by mouth daily.   Finacea 15 % gel Generic drug: Azelaic Acid Apply topically 2 (two) times daily. After skin is thoroughly washed and patted dry, gently but thoroughly massage a thin film of azelaic acid cream into the affected area twice daily, in the morning and evening.   fluticasone 50 MCG/ACT nasal spray Commonly known as: FLONASE SPRAY 1 SPRAY INTO EACH NOSTRIL TWICE A DAY AS NEEDED What changed: See the new instructions.   L-glutamine 5 g Pack Powder Packet Commonly known as: ENDARI Take by mouth 2 (two) times daily.   Lo Loestrin Fe 1 MG-10 MCG / 10 MCG tablet Generic drug: Norethindrone-Ethinyl Estradiol-Fe Biphas Take 1 tablet by mouth daily.   VITAMIN D-VITAMIN K PO Take by mouth. 4000/100        All past medical history, surgical history, allergies, family history, immunizations andmedications were updated in the EMR today and reviewed under the history and medication portions of their EMR.      No results found.   ROS 14 pt review of systems performed and negative (unless mentioned in an HPI)  Objective: BP 138/88   Pulse 77   Temp 97.6 F (36.4 C)   Ht 5\' 7"  (1.702 m)   Wt 172 lb 9.6 oz (78.3 kg)   SpO2 99%   BMI 27.03 kg/m  Physical Exam Vitals and nursing note reviewed.  Constitutional:      General: She is not in acute distress.    Appearance: Normal appearance. She is normal weight. She is not ill-appearing or toxic-appearing.  HENT:     Head: Normocephalic and atraumatic.  Eyes:     General: No scleral icterus.       Right eye: No discharge.        Left eye: No discharge.     Extraocular Movements: Extraocular movements intact.     Conjunctiva/sclera:  Conjunctivae normal.     Pupils: Pupils are equal, round, and reactive to light.  Cardiovascular:     Rate and Rhythm: Normal rate and regular rhythm.  Pulmonary:     Effort: Pulmonary effort is normal.     Breath sounds: Normal breath sounds.  Skin:    Findings: No rash.  Neurological:     Mental Status: She is alert and oriented to person, place, and time. Mental status is at baseline.     Motor: No weakness.     Coordination: Coordination normal.     Gait: Gait normal.  Psychiatric:        Mood and Affect: Mood normal.        Behavior: Behavior normal.        Thought Content: Thought content normal.        Judgment: Judgment normal.     Assessment/plan: Karen Short is a 57 y.o. female present for TOC-establishing care with new physician Anxiety disorder, unspecified type/hot flashes/ANA positive-rheumatoid factor positive/polyarthralgia Stable Continue Cymbalta 20 mg daily Lipid screening/FH: stroke/FH: heart disease/Bmi 25-29 - Hemoglobin A1c - Lipid panel Diabetes mellitus screening - Hemoglobin A1c   Routine general medical examination at a health care facility - CBC - Comprehensive metabolic panel - Lipid panel - TSH Patient was encouraged to exercise greater than 150 minutes a week. Patient was encouraged to choose a diet filled with fresh fruits and vegetables, and lean meats.  AVS provided to patient today for education/recommendation on gender specific health and safety maintenance. Colonoscopy:03/2017- 10 yr, Dr. Doristine Johns Mammogram:10/16/2021- gyn- requested Cervical cancer screening:08/2022- requested Immunizations: tdap UTD 2023, shingrix (schedule nurse appt), influenza UTD Infectious disease screening: hiv/hep completed  Return in about 24 weeks (around 07/14/2023) for Routine chronic condition follow-up.  Orders Placed This Encounter  Procedures   CBC   Comprehensive metabolic panel   Hemoglobin A1c   Lipid panel   TSH   Meds ordered  this encounter  Medications   DULoxetine (CYMBALTA) 20 MG capsule    Sig: Take 1 capsule (20 mg total) by mouth daily.    Dispense:  90 capsule    Refill:  1   Referral Orders  No referral(s) requested today     Note is dictated utilizing voice recognition software. Although note has been proof read prior to signing, occasional typographical errors still can be missed. If any questions arise, please do not hesitate to call for verification.  Electronically signed by: Felix Pacini, DO [facility] [place]

## 2023-01-27 NOTE — Patient Instructions (Addendum)

## 2023-01-28 ENCOUNTER — Telehealth: Payer: Self-pay | Admitting: Family Medicine

## 2023-01-28 ENCOUNTER — Encounter: Payer: Self-pay | Admitting: Family Medicine

## 2023-01-28 ENCOUNTER — Ambulatory Visit
Admission: RE | Admit: 2023-01-28 | Discharge: 2023-01-28 | Disposition: A | Discharge status: Home / Self Care | Payer: BC Managed Care – PPO | Source: Ambulatory Visit | Attending: Obstetrics and Gynecology | Admitting: Obstetrics and Gynecology

## 2023-01-28 DIAGNOSIS — D72819 Decreased white blood cell count, unspecified: Secondary | ICD-10-CM | POA: Insufficient documentation

## 2023-01-28 DIAGNOSIS — N6012 Diffuse cystic mastopathy of left breast: Secondary | ICD-10-CM | POA: Diagnosis not present

## 2023-01-28 DIAGNOSIS — N6321 Unspecified lump in the left breast, upper outer quadrant: Secondary | ICD-10-CM | POA: Diagnosis not present

## 2023-01-28 DIAGNOSIS — N649 Disorder of breast, unspecified: Secondary | ICD-10-CM

## 2023-01-28 DIAGNOSIS — R7989 Other specified abnormal findings of blood chemistry: Secondary | ICD-10-CM | POA: Insufficient documentation

## 2023-01-28 DIAGNOSIS — N6322 Unspecified lump in the left breast, upper inner quadrant: Secondary | ICD-10-CM | POA: Diagnosis not present

## 2023-01-28 LAB — HM MAMMOGRAPHY

## 2023-01-28 NOTE — Telephone Encounter (Signed)
Pt advised of results. 

## 2023-01-28 NOTE — Telephone Encounter (Signed)
Spoke with patient regarding results/recommendations.  

## 2023-01-28 NOTE — Telephone Encounter (Addendum)
Please call patient: blood cell counts are normal-with the exception of mildly low WBC Liver and kidney function are normal. Cholesterol panel looks excellent. Glucose levels are normal.  A1c is excellent at 5.4. Thyroid is mildly abnormal.  elevated TSH means a slightly underactive thyroid gland.  We will also need to retest a full thyroid panel and CBC with differential in 4 weeks by lab appointment only.  It is not uncommon for previous values to wax and wane over time, therefore waiting 4 weeks to ensure they return to normal is needed.   Lab appointment only in 4 weeks for review above.  Orders are placed.

## 2023-01-30 DIAGNOSIS — S62025D Nondisplaced fracture of middle third of navicular [scaphoid] bone of left wrist, subsequent encounter for fracture with routine healing: Secondary | ICD-10-CM | POA: Diagnosis not present

## 2023-01-30 DIAGNOSIS — S52135D Nondisplaced fracture of neck of left radius, subsequent encounter for closed fracture with routine healing: Secondary | ICD-10-CM | POA: Diagnosis not present

## 2023-02-06 ENCOUNTER — Ambulatory Visit: Payer: BC Managed Care – PPO | Attending: Internal Medicine | Admitting: Internal Medicine

## 2023-02-06 VITALS — BP 128/72 | HR 73 | Ht 67.0 in | Wt 176.0 lb

## 2023-02-06 DIAGNOSIS — E785 Hyperlipidemia, unspecified: Secondary | ICD-10-CM | POA: Diagnosis not present

## 2023-02-06 DIAGNOSIS — I2510 Atherosclerotic heart disease of native coronary artery without angina pectoris: Secondary | ICD-10-CM | POA: Diagnosis not present

## 2023-02-06 NOTE — Progress Notes (Signed)
OFFICE CONSULT NOTE  Chief Complaint:  Palpitations, coronary artery calcium  Primary Care Physician: Natalia Leatherwood, DO  HPI:  Karen Short is a 57 y.o. female who is being seen today for the evaluation of palpitations and CAC at the request of Kuneff, Renee A, DO. This is a 57 year old female kindly referred for evaluation of an abnormal calcium score.  She has a history of smoking remotely and underwent screening coronary calcium score recently which showed a calcium score of 18.8, 84th percentile for age and sex matched controls.  Subsequently she had a chest CT which showed some pulmonary nodules and has been referred to pulmonary.  She reports a family history of MI in her father when he was 71.  She is not on any statin therapy.  She has had pretty good lipids with total cholesterol 168, triglycerides 78, HDL 66 and LDL 86.  She reports being very active and eats a low-fat diet.  PMHx:  Past Medical History:  Diagnosis Date   Arthritis    Broken bones    scaphoid and elbow   Endometrial polyp    GAD (generalized anxiety disorder)    History of frequent urinary tract infections    Iron deficiency anemia    Osteopenia    Polyarthralgia    06-24-2022  per pt just tested positive RA this past friday 06-21-2022, test done by orthopedic   PONV (postoperative nausea and vomiting)    Seasonal allergies    Somatic dysfunction of back    Uterine fibroid    Wears glasses     Past Surgical History:  Procedure Laterality Date   BREAST LUMPECTOMY Left 2008   benign;2018/2022/2023   COLONOSCOPY  2019   DILATATION & CURETTAGE/HYSTEROSCOPY WITH MYOSURE N/A 06/28/2022   Procedure: DILATATION & CURETTAGE/HYSTEROSCOPY WITH MYOSURE;  Surgeon: Richardean Chimera, MD;  Location: [facility];  Service: Gynecology;  Laterality: N/A;   HYSTEROSCOPY WITH D & C  09/2021   06/2022   KNEE ARTHROSCOPY W/ MENISCAL REPAIR Right 2014   LAPAROSCOPIC GELPORT ASSISTED MYOMECTOMY   2011   TONSILLECTOMY     child-1973    FAMHx:  Family History  Problem Relation Age of Onset   Kidney disease Mother    Miscarriages / [place] Mother    COPD Mother    Cancer Mother        skin   Arthritis Mother    Hyperlipidemia Mother    Heart disease Mother    Stroke Mother    Hypertension Mother    Diabetes Mother    Heart attack Mother    Heart attack Father    Hearing loss Father    Cancer Father        skin   Arthritis Father    Heart disease Father    Stroke Father    Hypertension Father    Asthma Brother    Hearing loss Brother    Arthritis Brother    Breast cancer Maternal Aunt    Breast cancer Paternal Aunt    Colon polyps Neg Hx    Colon cancer Neg Hx    Esophageal cancer Neg Hx    Rectal cancer Neg Hx    Stomach cancer Neg Hx     SOCHx:   reports that she quit smoking about 37 years ago. Her smoking use included cigarettes. She started smoking about 47 years ago. She has been exposed to tobacco smoke. She has never used smokeless tobacco. She reports  current alcohol use of about 4.0 - 5.0 standard drinks of alcohol per week. She reports that she does not use drugs.  ALLERGIES:  Allergies  Allergen Reactions   Epinephrine     Lightheaded, things spin and "start to go dark", rapid HR per patient.   Latex Rash    ROS: Pertinent items noted in HPI and remainder of comprehensive ROS otherwise negative.  HOME MEDS: Current Outpatient Medications on File Prior to Visit  Medication Sig Dispense Refill   Azelaic Acid (FINACEA) 15 % gel Apply topically 2 (two) times daily. After skin is thoroughly washed and patted dry, gently but thoroughly massage a thin film of azelaic acid cream into the affected area twice daily, in the morning and evening.     DULoxetine (CYMBALTA) 20 MG capsule Take 1 capsule (20 mg total) by mouth daily. 90 capsule 1   LO LOESTRIN FE 1 MG-10 MCG / 10 MCG tablet Take 1 tablet by mouth daily.     Multiple Vitamin (DAILY  VITAMIN) tablet Take 1 tablet by mouth daily. Take 1 tablet by mouth.     VITAMIN D-VITAMIN K PO Take by mouth. 4000/100     fluticasone (FLONASE) 50 MCG/ACT nasal spray SPRAY 1 SPRAY INTO EACH NOSTRIL TWICE A DAY AS NEEDED (Patient not taking: Reported on 02/06/2023) 48 mL 2   L-glutamine (ENDARI) 5 g PACK Powder Packet Take by mouth 2 (two) times daily. (Patient not taking: Reported on 02/06/2023)     No current facility-administered medications on file prior to visit.    LABS/IMAGING: No results found for this or any previous visit (from the past 48 hours). No results found.  LIPID PANEL:    Component Value Date/Time   CHOL 168 01/27/2023 0902   TRIG 78.0 01/27/2023 0902   HDL 66.30 01/27/2023 0902   CHOLHDL 3 01/27/2023 0902   VLDL 15.6 01/27/2023 0902   LDLCALC 86 01/27/2023 0902    WEIGHTS: Wt Readings from Last 3 Encounters:  01/27/23 172 lb 9.6 oz (78.3 kg)  01/01/23 176 lb (79.8 kg)  12/31/22 176 lb (79.8 kg)    VITALS: There were no vitals taken for this visit.  EXAM: General appearance: alert and no distress Neck: no carotid bruit, no JVD, and thyroid not enlarged, symmetric, no tenderness/mass/nodules Lungs: clear to auscultation bilaterally Heart: regular rate and rhythm, S1, S2 normal, no murmur, click, rub or gallop Abdomen: soft, non-tender; bowel sounds normal; no masses,  no organomegaly Extremities: extremities normal, atraumatic, no cyanosis or edema Pulses: 2+ and symmetric Skin: Skin color, texture, turgor normal. No rashes or lesions Neurologic: Grossly normal Psych: Pleasant  EKG: EKG Interpretation Date/Time:  Thursday February 06 2023 16:16:40 EST Ventricular Rate:  70 PR Interval:  122 QRS Duration:  78 QT Interval:  386 QTC Calculation: 416 R Axis:   56  Text Interpretation: Normal sinus rhythm Normal ECG No previous ECGs available Confirmed by Zoila Shutter [phone]) on 02/06/2023 4:17:11 PM    ASSESSMENT: Mixed dyslipidemia, goal  LDL less than 70 CAC score of 18.8, 84th percentile Family history of premature coronary disease in her father  PLAN: 1.  Ms. Roby Lofts was found to have an elevated calcium score for her age but has no symptoms of obstructive coronary disease.  Her LDL target is advised to be below 70.  She is not currently on any lipid-lowering therapies.  As there is a family history of early onset heart disease in her father, would suspect that this may be  a genetic cholesterol disorder.  I would like to check an LP(a).  If this is elevated it may impact therapeutic choices.  Ultimately I would likely advise starting low-dose statin.  One option would be rosuvastatin 5 mg daily.  Will likely plan to follow-up with lipid testing in about 3 to 4 months if she is agreeable to this.  Thanks again for the kind referral.  Chrystie Nose, MD, [facility]  [place]  [facility]  Medical Director of the Advanced Lipid Disorders &  Cardiovascular Risk Reduction Clinic Diplomate of the American Board of Clinical Lipidology Attending Cardiologist  Direct Dial: [phone]  Fax: [phone]  Website:  [email or web address] 02/06/2023, 4:17 PM

## 2023-02-06 NOTE — Patient Instructions (Signed)
Medication Instructions:  No changes *If you need a refill on your cardiac medications before your next appointment, please call your pharmacy*   Lab Work: [facility] Nmr lipids in 4 months  If you have labs (blood work) drawn today and your tests are completely normal, you will receive your results only by: MyChart Message (if you have MyChart) OR A paper copy in the mail If you have any lab test that is abnormal or we need to change your treatment, we will call you to review the results.   Testing/Procedures: None    Follow-Up: At [facility], you and your health needs are our priority.  As part of our continuing mission to provide you with exceptional heart care, we have created designated Provider Care Teams.  These Care Teams include your primary Cardiologist (physician) and Advanced Practice Providers (APPs -  Physician Assistants and Nurse Practitioners) who all work together to provide you with the care you need, when you need it.   Your next appointment:   4 month(s)  Provider:   Rennis Golden

## 2023-02-07 DIAGNOSIS — M5127 Other intervertebral disc displacement, lumbosacral region: Secondary | ICD-10-CM | POA: Diagnosis not present

## 2023-02-07 DIAGNOSIS — M9904 Segmental and somatic dysfunction of sacral region: Secondary | ICD-10-CM | POA: Diagnosis not present

## 2023-02-07 DIAGNOSIS — M9905 Segmental and somatic dysfunction of pelvic region: Secondary | ICD-10-CM | POA: Diagnosis not present

## 2023-02-07 DIAGNOSIS — M9901 Segmental and somatic dysfunction of cervical region: Secondary | ICD-10-CM | POA: Diagnosis not present

## 2023-02-07 NOTE — Addendum Note (Signed)
Addended by: Lindell Spar on: 02/07/2023 08:45 AM   Modules accepted: Orders

## 2023-02-09 ENCOUNTER — Other Ambulatory Visit: Payer: Self-pay | Admitting: Family Medicine

## 2023-02-09 DIAGNOSIS — J302 Other seasonal allergic rhinitis: Secondary | ICD-10-CM

## 2023-02-14 ENCOUNTER — Ambulatory Visit: Payer: BC Managed Care – PPO | Admitting: Family Medicine

## 2023-02-21 DIAGNOSIS — M9905 Segmental and somatic dysfunction of pelvic region: Secondary | ICD-10-CM | POA: Diagnosis not present

## 2023-02-21 DIAGNOSIS — M9901 Segmental and somatic dysfunction of cervical region: Secondary | ICD-10-CM | POA: Diagnosis not present

## 2023-02-21 DIAGNOSIS — M5127 Other intervertebral disc displacement, lumbosacral region: Secondary | ICD-10-CM | POA: Diagnosis not present

## 2023-02-21 DIAGNOSIS — M9904 Segmental and somatic dysfunction of sacral region: Secondary | ICD-10-CM | POA: Diagnosis not present

## 2023-02-24 ENCOUNTER — Other Ambulatory Visit: Payer: BC Managed Care – PPO

## 2023-03-03 ENCOUNTER — Ambulatory Visit (INDEPENDENT_AMBULATORY_CARE_PROVIDER_SITE_OTHER): Payer: Self-pay | Admitting: Family Medicine

## 2023-03-03 DIAGNOSIS — M25532 Pain in left wrist: Secondary | ICD-10-CM

## 2023-03-03 DIAGNOSIS — G8929 Other chronic pain: Secondary | ICD-10-CM

## 2023-03-03 DIAGNOSIS — M79645 Pain in left finger(s): Secondary | ICD-10-CM

## 2023-03-03 NOTE — Progress Notes (Signed)
   [facility] [address] [place] [address] Phone: [phone]   Extracorporeal Shockwave Therapy Note    Patient is being treated today with ECSWT. Informed consent was obtained and patient tolerated procedure well.   Therapy performed by Artist Karen  Condition treated: Chronic left thumb and ulnar wrist Treatment preset used: [employer] used: 10 mJ Frequency used: 12 Hz Number of pulses: 2000 total.  1300 thumb and 900 ulnar wrist Head Size: Large Treatment #4 of #4-8  Electronically signed by:  Artist [facility] 9:06 AM 03/03/23 She has a follow-up appointment scheduled with her hand surgeon later this week anticipate probably some advanced imaging like MRI arthrogram.

## 2023-03-03 NOTE — Patient Instructions (Signed)
 Thank you for coming in today.   We can arrange more shockwave.

## 2023-03-04 ENCOUNTER — Other Ambulatory Visit (INDEPENDENT_AMBULATORY_CARE_PROVIDER_SITE_OTHER): Payer: BC Managed Care – PPO

## 2023-03-04 DIAGNOSIS — R7989 Other specified abnormal findings of blood chemistry: Secondary | ICD-10-CM

## 2023-03-04 DIAGNOSIS — R002 Palpitations: Secondary | ICD-10-CM | POA: Diagnosis not present

## 2023-03-04 DIAGNOSIS — R946 Abnormal results of thyroid function studies: Secondary | ICD-10-CM | POA: Diagnosis not present

## 2023-03-04 DIAGNOSIS — D72819 Decreased white blood cell count, unspecified: Secondary | ICD-10-CM

## 2023-03-04 LAB — CBC WITH DIFFERENTIAL/PLATELET
Basophils Absolute: 0 10*3/uL (ref 0.0–0.1)
Basophils Relative: 0.9 % (ref 0.0–3.0)
Eosinophils Absolute: 0.1 10*3/uL (ref 0.0–0.7)
Eosinophils Relative: 1.8 % (ref 0.0–5.0)
HCT: 40.3 % (ref 36.0–46.0)
Hemoglobin: 13.4 g/dL (ref 12.0–15.0)
Lymphocytes Relative: 32.9 % (ref 12.0–46.0)
Lymphs Abs: 1.2 10*3/uL (ref 0.7–4.0)
MCHC: 33.4 g/dL (ref 30.0–36.0)
MCV: 96.3 fL (ref 78.0–100.0)
Monocytes Absolute: 0.4 10*3/uL (ref 0.1–1.0)
Monocytes Relative: 11.3 % (ref 3.0–12.0)
Neutro Abs: 2 10*3/uL (ref 1.4–7.7)
Neutrophils Relative %: 53.1 % (ref 43.0–77.0)
Platelets: 210 10*3/uL (ref 150.0–400.0)
RBC: 4.18 Mil/uL (ref 3.87–5.11)
RDW: 12.6 % (ref 11.5–15.5)
WBC: 3.7 10*3/uL — ABNORMAL LOW (ref 4.0–10.5)

## 2023-03-04 LAB — T3, FREE: T3, Free: 2.8 pg/mL (ref 2.3–4.2)

## 2023-03-04 LAB — TSH: TSH: 7.46 u[IU]/mL — ABNORMAL HIGH (ref 0.35–5.50)

## 2023-03-04 LAB — T4, FREE: Free T4: 1.09 ng/dL (ref 0.60–1.60)

## 2023-03-05 ENCOUNTER — Telehealth: Payer: Self-pay | Admitting: Family Medicine

## 2023-03-05 LAB — LIPOPROTEIN A (LPA): Lipoprotein (a): 15.1 nmol/L (ref <75.0)

## 2023-03-05 MED ORDER — LEVOTHYROXINE SODIUM 25 MCG PO TABS: 25.0000 ug | ORAL_TABLET | Freq: Every day | ORAL | 0 refills | Status: DC

## 2023-03-05 NOTE — Telephone Encounter (Signed)
 Results and recommendations given to pt and pt scheduled for Friday to discuss

## 2023-03-05 NOTE — Telephone Encounter (Signed)
 Please inform patient Her white blood cell counts have increased back to where they have been in the past for her.  Which is reassuring. Her TSH result is higher than it was prior, now meaning the thyroid  gland is underactive. -Hypothyroidism, is underactive thyroid , is treated with levothyroxine  medication that is taken daily on empty stomach.  I have called in the levothyroxine  25 mcg daily medication for her to start for soon as possible.  Follow-up with provider in 6 weeks.  We will recheck the thyroid  levels at that time, and taper up on dose if needed.  If she would like like to discuss hypothyroidism in more detail sooner than 6 weeks, we can set her up for an in person or virtual visit to discuss.

## 2023-03-05 NOTE — Telephone Encounter (Signed)
 LM for pt to return call to discuss.

## 2023-03-06 DIAGNOSIS — S62025D Nondisplaced fracture of middle third of navicular [scaphoid] bone of left wrist, subsequent encounter for fracture with routine healing: Secondary | ICD-10-CM | POA: Diagnosis not present

## 2023-03-06 DIAGNOSIS — S52135D Nondisplaced fracture of neck of left radius, subsequent encounter for closed fracture with routine healing: Secondary | ICD-10-CM | POA: Diagnosis not present

## 2023-03-07 ENCOUNTER — Encounter: Payer: Self-pay | Admitting: Family Medicine

## 2023-03-07 ENCOUNTER — Telehealth (INDEPENDENT_AMBULATORY_CARE_PROVIDER_SITE_OTHER): Payer: BC Managed Care – PPO | Admitting: Family Medicine

## 2023-03-07 VITALS — Wt 169.0 lb

## 2023-03-07 DIAGNOSIS — R911 Solitary pulmonary nodule: Secondary | ICD-10-CM | POA: Diagnosis not present

## 2023-03-07 DIAGNOSIS — C4491 Basal cell carcinoma of skin, unspecified: Secondary | ICD-10-CM | POA: Insufficient documentation

## 2023-03-07 DIAGNOSIS — R931 Abnormal findings on diagnostic imaging of heart and coronary circulation: Secondary | ICD-10-CM

## 2023-03-07 DIAGNOSIS — C44310 Basal cell carcinoma of skin of unspecified parts of face: Secondary | ICD-10-CM

## 2023-03-07 DIAGNOSIS — I2510 Atherosclerotic heart disease of native coronary artery without angina pectoris: Secondary | ICD-10-CM | POA: Insufficient documentation

## 2023-03-07 DIAGNOSIS — E063 Autoimmune thyroiditis: Secondary | ICD-10-CM | POA: Diagnosis not present

## 2023-03-07 NOTE — Progress Notes (Signed)
VIRTUAL VISIT VIA VIDEO  I connected with Karen Short on 03/07/23 at  9:40 AM EST by a video enabled telemedicine application and verified that I am speaking with the correct person using two identifiers. Location patient: Home Location provider: [facility], Office Persons participating in the virtual visit: Patient, Dr. Claiborne Billings and Ivonne Andrew, CMA  I discussed the limitations of evaluation and management by telemedicine and the availability of in person appointments. The patient expressed understanding and agreed to proceed.    Karen Short [facility] , 1965-11-01, 58 y.o., female MRN: [id number] Patient Care Team    Relationship Specialty Notifications Start End  Natalia Leatherwood, DO PCP - General Family Medicine  07/23/22   Richardean Chimera, MD Consulting Physician Obstetrics and Gynecology  07/25/17   Beryle Flock, PT Physical Therapist Physical Therapy  08/17/19   Elise Benne, MD Consulting Physician Ophthalmology  07/18/22   Fuller Plan, MD Consulting Physician Rheumatology  07/18/22   Judi Saa, DO Consulting Physician Sports Medicine  07/18/22   Carman Ching, MD (Inactive) Consulting Physician Gastroenterology  07/23/22   Chrystie Nose, MD Consulting Physician Cardiology  03/07/23   Josephine Igo, DO Consulting Physician Pulmonary Disease  03/07/23     Chief Complaint  Patient presents with   Fatigue    Pt wants to discuss recent TSH levels; if she needs medication or not.      Subjective: Karen Short is a 58 y.o. Pt presents for an OV with to discuss hypothyroidism diagnoses.  Patient has now had 2 abnormal TSH consecutively over the last 6 weeks.  Second T SH had increased further 7.46. Patient states that over the last year she has a lot going on as far as new health conditions and diagnoses.  She has been diagnosed with RA/systemic lupus by rheumatology.  Coronary calcium score was found to be 18 and she was referred to cardiology,  which recommended she start a statin-and she has concerns about starting a statin, so they agreed to retest in 4 months.  She was found to have a small lung nodule of unknown significance on the coronary CT/chest screen, so she is also referred to pulmonology.  She was found to have a basal cell and had a Mohs procedure.  And this was on top of her fracturing her wrist last year, and then finding out she has mildly decreased bone density/osteopenia. She is wondering if there is something homeopathic she can do to help with her thyroid status starting medication.  Coronary calcium score 11/2022: Total Agatston Score: 18.8 MESA database percentile: 84   AORTA MEASUREMENTS: Ascending Aorta: 3.2 cm Descending Aorta:2.1 cm   IMPRESSION: Total Agatston score: 18.8 [place] database percentile: 84  Chest CT 2024: Solid pulmonary nodule of the right upper lobe measuring 4 mm on series 3, image 121. Perifissural solid nodule of the right upper lobe measuring 6 mm on series 3, image 192, likely an intrapulmonary lymph node.     07/23/2022    2:19 PM 01/25/2022    8:04 AM 08/10/2021    1:13 PM 12/29/2020   11:27 AM 08/31/2018    2:12 PM  Depression screen PHQ 2/9  Decreased Interest 0 0 0 0 0  Down, Depressed, Hopeless 0 0 0 0 0  PHQ - 2 Score 0 0 0 0 0  Altered sleeping  0 0    Tired, decreased energy  1 1    Change in  appetite  0 0    Feeling bad or failure about yourself   0 0    Trouble concentrating  0 0    Moving slowly or fidgety/restless  0 0    Suicidal thoughts  0 0    PHQ-9 Score  1 1    Difficult doing work/chores  Not difficult at all Not difficult at all      Allergies  Allergen Reactions   Epinephrine     Lightheaded, things spin and "start to go dark", rapid HR per patient.   Latex Rash   Social History   Social History Narrative   Marital status/children/pets: Divorced, G0P0   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   Safety:         -smoke alarm in the home:Yes      - wears seatbelt: Yes     - Feels safe in their relationships: Yes      Past Medical History:  Diagnosis Date   Arthritis    Broken bones    scaphoid and elbow   Closed nondisplaced fracture of scaphoid of left wrist 10/24/2022   Endometrial polyp    GAD (generalized anxiety disorder)    History of frequent urinary tract infections    Iron deficiency anemia    Osteopenia    Polyarthralgia    06-24-2022  per pt just tested positive RA this past friday 06-21-2022, test done by orthopedic   PONV (postoperative nausea and vomiting)    Seasonal allergies    Somatic dysfunction of back    Uterine fibroid    Wears glasses    Past Surgical History:  Procedure Laterality Date   BREAST LUMPECTOMY Left 2008   benign;2018/2022/2023   COLONOSCOPY  2019   DILATATION & CURETTAGE/HYSTEROSCOPY WITH MYOSURE N/A 06/28/2022   Procedure: DILATATION & CURETTAGE/HYSTEROSCOPY WITH MYOSURE;  Surgeon: Richardean Chimera, MD;  Location:  SURGERY CENTER;  Service: Gynecology;  Laterality: N/A;   HYSTEROSCOPY WITH D & C  09/2021   06/2022   KNEE ARTHROSCOPY W/ MENISCAL REPAIR Right 2014   LAPAROSCOPIC GELPORT ASSISTED MYOMECTOMY  2011   TONSILLECTOMY     child-1973   Family History  Problem Relation Age of Onset   Kidney disease Mother    Miscarriages / [place] Mother    COPD Mother    Cancer Mother        skin   Arthritis Mother    Hyperlipidemia Mother    Heart disease Mother    Stroke Mother    Hypertension Mother    Diabetes Mother    Heart attack Mother    Heart attack Father    Hearing loss Father    Cancer Father        skin   Arthritis Father    Heart disease Father    Stroke Father    Hypertension Father    Asthma Brother    Hearing loss Brother    Arthritis Brother    Breast cancer Maternal Aunt    Breast cancer Paternal Aunt    Colon polyps Neg Hx    Colon cancer Neg Hx    Esophageal cancer Neg Hx    Rectal cancer Neg Hx    Stomach cancer Neg Hx     Allergies as of 03/07/2023       Reactions   Epinephrine    Lightheaded, things spin and "start to go dark", rapid HR per patient.   Latex Rash        Medication List  Accurate as of March 07, 2023 11:48 AM. If you have any questions, ask your nurse or doctor.          STOP taking these medications    fluticasone 50 MCG/ACT nasal spray Commonly known as: FLONASE Stopped by: Felix Pacini   L-glutamine 5 g Pack Powder Packet Commonly known as: ENDARI Stopped by: Felix Pacini       TAKE these medications    Daily Vitamin tablet Take 1 tablet by mouth daily. Take 1 tablet by mouth.   DULoxetine 20 MG capsule Commonly known as: Cymbalta Take 1 capsule (20 mg total) by mouth daily.   Finacea 15 % gel Generic drug: Azelaic Acid Apply topically 2 (two) times daily. After skin is thoroughly washed and patted dry, gently but thoroughly massage a thin film of azelaic acid cream into the affected area twice daily, in the morning and evening.   levothyroxine 25 MCG tablet Commonly known as: SYNTHROID Take 1 tablet (25 mcg total) by mouth daily.   Lo Loestrin Fe 1 MG-10 MCG / 10 MCG tablet Generic drug: Norethindrone-Ethinyl Estradiol-Fe Biphas Take 1 tablet by mouth daily.   VITAMIN D-VITAMIN K PO Take by mouth. 4000/100        All past medical history, surgical history, allergies, family history, immunizations andmedications were updated in the EMR today and reviewed under the history and medication portions of their EMR.     ROS Negative, with the exception of above mentioned in HPI   Objective:  Wt 169 lb (76.7 kg)   BMI 26.47 kg/m  Body mass index is 26.47 kg/m. Physical Exam Vitals and nursing note reviewed.  Constitutional:      General: She is not in acute distress.    Appearance: Normal appearance. She is not toxic-appearing.  HENT:     Head: Normocephalic and atraumatic.  Eyes:     General: No scleral icterus.       Right eye:  No discharge.        Left eye: No discharge.     Conjunctiva/sclera: Conjunctivae normal.  Pulmonary:     Effort: Pulmonary effort is normal.  Musculoskeletal:     Cervical back: Normal range of motion.  Skin:    Findings: No rash.  Neurological:     Mental Status: She is alert and oriented to person, place, and time. Mental status is at baseline.  Psychiatric:        Mood and Affect: Mood normal.        Behavior: Behavior normal.        Thought Content: Thought content normal.        Judgment: Judgment normal.      No results found. No results found. No results found for this or any previous visit (from the past 24 hours).  Assessment/Plan: Alayna Sveum is a 58 y.o. female present for OV for  Hypothyroidism due to Hashimoto thyroiditis (Primary) Lengthy discussion today surrounding hypothyroidism diagnosis and lab results with increasing TSH x 2. She understands there is nothing over-the-counter that replaces the thyroid hormone needed in order for her bodies to function normally. We discussed autoimmune disorders.  She was assured that there was nothing she could have done differently to avoid a hypothyroid condition. She will start levothyroxine 25 mcg daily on an empty stomach Follow-up 6 weeks with provider and repeat thyroid panel and CBC  Pulmonary nodule Patient has an appointment scheduled in 2 weeks with pulmonology to further evaluate.  Coronary artery disease  involving native coronary artery of native heart without angina pectoris/family history of heart disease/coronary calcium score 18.8: 84th percentile Patient has seen Dr. Hilty-cardiology and they agreed to repeat cholesterol in 4 months and decide on statin at that time.  Patient does not desire to start a medication since her cholesterol has been good.  She understands her LDL goal now is less than 70. Coronary calcium score: 18.8, 84th percentile 11-2022 LDL 86  Basal cell carcinoma (BCC) of skin  of face, unspecified part of face Routine dermatology follow-ups   Reviewed expectations re: course of current medical issues. Discussed self-management of symptoms. Outlined signs and symptoms indicating need for more acute intervention. Patient verbalized understanding and all questions were answered. Patient received an After-Visit Summary.    No orders of the defined types were placed in this encounter.  No orders of the defined types were placed in this encounter.  Referral Orders  No referral(s) requested today     Note is dictated utilizing voice recognition software. Although note has been proof read prior to signing, occasional typographical errors still can be missed. If any questions arise, please do not hesitate to call for verification.   electronically signed by:  Felix Pacini, DO  [facility] - [place]

## 2023-03-07 NOTE — Patient Instructions (Signed)

## 2023-03-10 DIAGNOSIS — H2513 Age-related nuclear cataract, bilateral: Secondary | ICD-10-CM | POA: Diagnosis not present

## 2023-03-10 DIAGNOSIS — H5213 Myopia, bilateral: Secondary | ICD-10-CM | POA: Diagnosis not present

## 2023-03-14 DIAGNOSIS — M9904 Segmental and somatic dysfunction of sacral region: Secondary | ICD-10-CM | POA: Diagnosis not present

## 2023-03-14 DIAGNOSIS — M5127 Other intervertebral disc displacement, lumbosacral region: Secondary | ICD-10-CM | POA: Diagnosis not present

## 2023-03-14 DIAGNOSIS — M9901 Segmental and somatic dysfunction of cervical region: Secondary | ICD-10-CM | POA: Diagnosis not present

## 2023-03-14 DIAGNOSIS — M9905 Segmental and somatic dysfunction of pelvic region: Secondary | ICD-10-CM | POA: Diagnosis not present

## 2023-03-17 ENCOUNTER — Ambulatory Visit: Payer: BC Managed Care – PPO | Admitting: Physical Therapy

## 2023-03-17 ENCOUNTER — Encounter: Payer: Self-pay | Admitting: Physical Therapy

## 2023-03-17 DIAGNOSIS — M6281 Muscle weakness (generalized): Secondary | ICD-10-CM | POA: Diagnosis not present

## 2023-03-17 DIAGNOSIS — M25532 Pain in left wrist: Secondary | ICD-10-CM

## 2023-03-17 DIAGNOSIS — M25522 Pain in left elbow: Secondary | ICD-10-CM | POA: Diagnosis not present
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
## 2023-03-18 ENCOUNTER — Ambulatory Visit (INDEPENDENT_AMBULATORY_CARE_PROVIDER_SITE_OTHER): Payer: Self-pay | Admitting: Family Medicine

## 2023-03-18 DIAGNOSIS — M79645 Pain in left finger(s): Secondary | ICD-10-CM

## 2023-03-18 DIAGNOSIS — G8929 Other chronic pain: Secondary | ICD-10-CM

## 2023-03-18 NOTE — Progress Notes (Signed)
   [facility] [address] [place] [address] Phone: [phone]   Extracorporeal Shockwave Therapy Note    Patient is being treated today with ECSWT. Informed consent was obtained and patient tolerated procedure well.   Therapy performed by Clementeen Graham  Condition treated: chronic left thumb pain Treatment preset used: custom low Energy used: 10 mJ Frequency used: 13 Hz Number of pulses: 2000 Head Size: large Treatment #5 of #8?  Electronically signed by:  [facility] 8:35 AM 03/18/23 Anticipate doing these treatments about every 2 weeks until she feels well enough that they do not make sense.  She feels benefit from them.

## 2023-03-19 ENCOUNTER — Ambulatory Visit: Payer: BC Managed Care – PPO | Admitting: Family Medicine

## 2023-03-25 ENCOUNTER — Ambulatory Visit: Payer: BC Managed Care – PPO | Admitting: Family Medicine

## 2023-03-27 ENCOUNTER — Institutional Professional Consult (permissible substitution): Payer: BC Managed Care – PPO | Admitting: Pulmonary Disease

## 2023-03-27 NOTE — Progress Notes (Signed)
 [facility] [address] [place] [address] Phone: [phone] Subjective:   Karen Short, am serving as a scribe for Dr. Arthea Claudene.  I'm seeing this patient by the request  of:  Kuneff, Renee A, DO  CC: Low back and neck pain follow-up, thumb pain follow-up  YEP:Dlagzrupcz  Karen Short is a 58 y.o. female coming in with complaint of back and neck pain. OMT 12/31/2022. Also f/u for L thumb pain. Has been doing shockwave treatments with Dr. Joane. Patient states thumb is improving. Pain and instability on the ulnar side. Does feel like shockwave has been helping.  Medications patient has been prescribed: None  Taking:         Reviewed prior external information including notes and imaging from previsou exam, outside providers and external EMR if available.   As well as notes that were available from care everywhere and other healthcare systems.  Past medical history, social, surgical and family history all reviewed in electronic medical record.  No pertanent information unless stated regarding to the chief complaint.   Past Medical History:  Diagnosis Date   Arthritis    Broken bones    scaphoid and elbow   Closed nondisplaced fracture of scaphoid of left wrist 10/24/2022   Endometrial polyp    GAD (generalized anxiety disorder)    History of frequent urinary tract infections    Iron deficiency anemia    Osteopenia    Polyarthralgia    06-24-2022  per pt just tested positive RA this past friday 06-21-2022, test done by orthopedic   PONV (postoperative nausea and vomiting)    Seasonal allergies    Somatic dysfunction of back    Uterine fibroid    Wears glasses     Allergies  Allergen Reactions   Epinephrine     Lightheaded, things spin and start to go dark, rapid HR per patient.   Latex Rash     Review of Systems:  No headache, visual changes, nausea, vomiting, diarrhea, constipation, dizziness, abdominal  pain, skin rash, fevers, chills, night sweats, weight loss, swollen lymph nodes, body aches, joint swelling, chest pain, shortness of breath, mood changes. POSITIVE muscle aches  Objective  Blood pressure 108/70, pulse 89, height 5' 7 (1.702 m), weight 176 lb (79.8 kg), SpO2 96%.   General: No apparent distress alert and oriented x3 mood and affect normal, dressed appropriately.  HEENT: Pupils equal, extraocular movements intact  Respiratory: Patient's speak in full sentences and does not appear Short of breath  Cardiovascular: No lower extremity edema, non tender, no erythema  Gait MSK:  Back does have some loss lordosis.  Tightness noted in the parascapular area.  Tightness going all the way to the potential neck area. Left ankle exam does have some tenderness over the peroneal tendon..  Osteopathic findings C6 flexed rotated and side bent left C7 flexed rotated and side bent left T3 extended rotated and side bent right inhaled rib T9 extended rotated and side bent left L2 flexed rotated and side bent right Sacrum right on right  Limited muscular skeletal ultrasound was performed and interpreted by CLAUDENE ARTHEA, M  Limited ultrasound of patient's ongoing otherwise calcific changes has completely resolved at this time. Mild increase in Doppler flow noted.     Assessment and Plan:  Polyarthralgia Continues to respond relatively well to osteopathic manipulation.  Right hip pain is secondary to the sacroiliac joint.  Patient does have rheumatoid arthritis and will continue to  monitor.  Recent diagnosis of hypothyroidism also could be potentially playing a role.  Discussed icing regimen and home exercises.  Follow-up again in 6 to 8 weeks  Left peroneal tendonosis Peroneal tendinosis noted.  Discussed icing regimen.  Exercises.  Discussed which activities to do and which ones to avoid.  Discussed heel lifts.  Discussed recovery sandals.    Nonallopathic problems  Decision today  to treat with OMT was based on Physical Exam  After verbal consent patient was treated with HVLA, ME, FPR techniques in cervical, rib, thoracic, lumbar, and sacral  areas  Patient tolerated the procedure well with improvement in symptoms  Patient given exercises, stretches and lifestyle modifications  See medications in patient instructions if given  Patient will follow up in 4-8 weeks     The above documentation has been reviewed and is accurate and complete Karen Short M Karen Bayon, DO        Note: This dictation was prepared with Dragon dictation along with smaller phrase technology. Any transcriptional errors that result from this process are unintentional.

## 2023-03-28 ENCOUNTER — Ambulatory Visit: Payer: BC Managed Care – PPO | Admitting: Family Medicine

## 2023-03-28 ENCOUNTER — Other Ambulatory Visit: Payer: Self-pay

## 2023-03-28 ENCOUNTER — Encounter: Payer: Self-pay | Admitting: Family Medicine

## 2023-03-28 VITALS — BP 108/70 | HR 89 | Ht 67.0 in | Wt 176.0 lb

## 2023-03-28 DIAGNOSIS — M9904 Segmental and somatic dysfunction of sacral region: Secondary | ICD-10-CM | POA: Diagnosis not present

## 2023-03-28 DIAGNOSIS — M9901 Segmental and somatic dysfunction of cervical region: Secondary | ICD-10-CM

## 2023-03-28 DIAGNOSIS — G8929 Other chronic pain: Secondary | ICD-10-CM | POA: Diagnosis not present

## 2023-03-28 DIAGNOSIS — M9908 Segmental and somatic dysfunction of rib cage: Secondary | ICD-10-CM | POA: Diagnosis not present

## 2023-03-28 DIAGNOSIS — M79645 Pain in left finger(s): Secondary | ICD-10-CM

## 2023-03-28 DIAGNOSIS — M9902 Segmental and somatic dysfunction of thoracic region: Secondary | ICD-10-CM

## 2023-03-28 DIAGNOSIS — M2550 Pain in unspecified joint: Secondary | ICD-10-CM

## 2023-03-28 DIAGNOSIS — M6788 Other specified disorders of synovium and tendon, other site: Secondary | ICD-10-CM | POA: Diagnosis not present

## 2023-03-28 DIAGNOSIS — M9903 Segmental and somatic dysfunction of lumbar region: Secondary | ICD-10-CM

## 2023-03-28 NOTE — Assessment & Plan Note (Signed)
 Peroneal tendinosis noted.  Discussed icing regimen.  Exercises.  Discussed which activities to do and which ones to avoid.  Discussed heel lifts.  Discussed recovery sandals.

## 2023-03-28 NOTE — Assessment & Plan Note (Signed)
 Continues to respond relatively well to osteopathic manipulation.  Right hip pain is secondary to the sacroiliac joint.  Patient does have rheumatoid arthritis and will continue to monitor.  Recent diagnosis of hypothyroidism also could be potentially playing a role.  Discussed icing regimen and home exercises.  Follow-up again in 6 to 8 weeks

## 2023-03-28 NOTE — Patient Instructions (Signed)
 Thumb is looking good Have 1 more shockwave appointment Heel lift or wear new [place]okas See you again in 4-5 weeks

## 2023-04-01 ENCOUNTER — Ambulatory Visit (INDEPENDENT_AMBULATORY_CARE_PROVIDER_SITE_OTHER): Payer: Self-pay | Admitting: Family Medicine

## 2023-04-01 DIAGNOSIS — M79645 Pain in left finger(s): Secondary | ICD-10-CM

## 2023-04-01 DIAGNOSIS — G8929 Other chronic pain: Secondary | ICD-10-CM

## 2023-04-01 NOTE — Progress Notes (Signed)
   [facility] [address] [place] [address] Phone: [phone]   Extracorporeal Shockwave Therapy Note    Patient is being treated today with ECSWT. Informed consent was obtained and patient tolerated procedure well.   Therapy performed by Clementeen Graham  Condition treated: Left thumb Treatment preset used: [employer] used: 10 mJ Frequency used: 15 Hz Number of pulses: 3000 Head Size: Large Treatment #6 of #6 for now  Electronically signed by:  [facility] 8:46 AM 04/01/23

## 2023-04-01 NOTE — Patient Instructions (Signed)
Return as needed

## 2023-04-08 ENCOUNTER — Encounter: Payer: BC Managed Care – PPO | Admitting: Physical Therapy

## 2023-04-16 DIAGNOSIS — M9904 Segmental and somatic dysfunction of sacral region: Secondary | ICD-10-CM | POA: Diagnosis not present

## 2023-04-16 DIAGNOSIS — M5127 Other intervertebral disc displacement, lumbosacral region: Secondary | ICD-10-CM | POA: Diagnosis not present

## 2023-04-16 DIAGNOSIS — M9905 Segmental and somatic dysfunction of pelvic region: Secondary | ICD-10-CM | POA: Diagnosis not present

## 2023-04-16 DIAGNOSIS — M9901 Segmental and somatic dysfunction of cervical region: Secondary | ICD-10-CM | POA: Diagnosis not present

## 2023-04-17 ENCOUNTER — Institutional Professional Consult (permissible substitution): Payer: BC Managed Care – PPO | Admitting: Emergency Medicine

## 2023-04-17 DIAGNOSIS — C4401 Basal cell carcinoma of skin of lip: Secondary | ICD-10-CM | POA: Diagnosis not present

## 2023-04-21 DIAGNOSIS — M9904 Segmental and somatic dysfunction of sacral region: Secondary | ICD-10-CM | POA: Diagnosis not present

## 2023-04-21 DIAGNOSIS — M5127 Other intervertebral disc displacement, lumbosacral region: Secondary | ICD-10-CM | POA: Diagnosis not present

## 2023-04-21 DIAGNOSIS — M9905 Segmental and somatic dysfunction of pelvic region: Secondary | ICD-10-CM | POA: Diagnosis not present

## 2023-04-21 DIAGNOSIS — M9901 Segmental and somatic dysfunction of cervical region: Secondary | ICD-10-CM | POA: Diagnosis not present

## 2023-04-24 NOTE — Progress Notes (Signed)
 [facility] [address] [place] [address] Phone: [phone] Subjective:    I'm seeing this patient by the request  of:  Short, Karen A, DO  CC: Left ankle, back pain and allover pain follow-up  [id number]  03/28/2023 Peroneal tendinosis noted. Discussed icing regimen. Exercises. Discussed which activities to do and which ones to avoid. Discussed heel lifts. Discussed recovery sandals.   Continues to respond relatively well to osteopathic manipulation.  Right hip pain is secondary to the sacroiliac joint.  Patient does have rheumatoid arthritis and will continue to monitor.  Recent diagnosis of hypothyroidism also could be potentially playing a role.  Discussed icing regimen and home exercises.  Follow-up again in 6 to 8 weeks    Update 04/25/2023 Karen Short is a 58 y.o. female coming in with complaint of L ankle pain. Patient states only a little bit better. Type of pain hasn't changed but frequency has gone down patient states that unfortunately things such as her hips are hurting more.  Concerned that some of it could be secondary to some of the the levothyroxine which is a newer medicine.  Since then he has gone on felt worse.  Not sleeping well.  Wants to know if she can get off of that or if anything else is contributing.  Just does not feel like herself.  Much more stiff than usual.       Past Medical History:  Diagnosis Date   Arthritis    Broken bones    scaphoid and elbow   Closed nondisplaced fracture of scaphoid of left wrist 10/24/2022   Endometrial polyp    GAD (generalized anxiety disorder)    History of frequent urinary tract infections    Iron deficiency anemia    Osteopenia    Polyarthralgia    06-24-2022  per pt just tested positive RA this past friday 06-21-2022, test done by orthopedic   PONV (postoperative nausea and vomiting)    Seasonal allergies    Somatic dysfunction of back    Uterine fibroid     Wears glasses    Past Surgical History:  Procedure Laterality Date   BREAST LUMPECTOMY Left 2008   benign;2018/2022/2023   COLONOSCOPY  2019   DILATATION & CURETTAGE/HYSTEROSCOPY WITH MYOSURE N/A 06/28/2022   Procedure: DILATATION & CURETTAGE/HYSTEROSCOPY WITH MYOSURE;  Surgeon: Richardean Chimera, MD;  Location: [facility];  Service: Gynecology;  Laterality: N/A;   HYSTEROSCOPY WITH D & C  09/2021   06/2022   KNEE ARTHROSCOPY W/ MENISCAL REPAIR Right 2014   LAPAROSCOPIC GELPORT ASSISTED MYOMECTOMY  2011   TONSILLECTOMY     child-1973   Social History   Socioeconomic History   Marital status: Legally Separated    Spouse name: Not on file   Number of children: Not on file   Years of education: Not on file   Highest education level: Master's degree (e.g., MA, MS, MEng, MEd, MSW, MBA)  Occupational History   Not on file  Tobacco Use   Smoking status: Former    Current packs/day: 0.00    Types: Cigarettes    Start date: 24    Quit date: 1987    Years since quitting: 38.2    Passive exposure: Past   Smokeless tobacco: Never  Vaping Use   Vaping status: Never Used  Substance and Sexual Activity   Alcohol use: Yes    Alcohol/week: 4.0 - 5.0 standard drinks of alcohol    Types: 4 -  5 Standard drinks or equivalent per week   Drug use: Never   Sexual activity: Not Currently    Partners: Female    Birth control/protection: None, Post-menopausal  Other Topics Concern   Not on file  Social History Narrative   Marital status/children/pets: Divorced, G0P0   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   Safety:         -smoke alarm in the home:Yes     - wears seatbelt: Yes     - Feels safe in their relationships: Yes      Social Drivers of Corporate investment banker Strain: Low Risk  (08/07/2021)   Overall Financial Resource Strain (CARDIA)    Difficulty of Paying Living Expenses: Not hard at all  Food Insecurity: No Food Insecurity (08/07/2021)   Hunger Vital  Sign    Worried About Running Out of Food in the Last Year: Never true    Ran Out of Food in the Last Year: Never true  Transportation Needs: No Transportation Needs (08/07/2021)   PRAPARE - Administrator, Civil Service (Medical): No    Lack of Transportation (Non-Medical): No  Physical Activity: Sufficiently Active (08/07/2021)   Exercise Vital Sign    Days of Exercise per Week: 5 days    Minutes of Exercise per Session: 40 min  Stress: Stress Concern Present (08/07/2021)   [employer] of Occupational Health - Occupational Stress Questionnaire    Feeling of Stress : To some extent  Social Connections: Socially Isolated (08/07/2021)   Social Connection and Isolation Panel [NHANES]    Frequency of Communication with Friends and Family: Once a week    Frequency of Social Gatherings with Friends and Family: Once a week    Attends Religious Services: Never    Database administrator or Organizations: No    Attends Engineer, structural: Not on file    Marital Status: Separated   Allergies  Allergen Reactions   Epinephrine     Lightheaded, things spin and "start to go dark", rapid HR per patient.   Latex Rash   Family History  Problem Relation Age of Onset   Kidney disease Mother    Miscarriages / [place] Mother    COPD Mother    Cancer Mother        skin   Arthritis Mother    Hyperlipidemia Mother    Heart disease Mother    Stroke Mother    Hypertension Mother    Diabetes Mother    Heart attack Mother    Heart attack Father    Hearing loss Father    Cancer Father        skin   Arthritis Father    Heart disease Father    Stroke Father    Hypertension Father    Asthma Brother    Hearing loss Brother    Arthritis Brother    Breast cancer Maternal Aunt    Breast cancer Paternal Aunt    Colon polyps Neg Hx    Colon cancer Neg Hx    Esophageal cancer Neg Hx    Rectal cancer Neg Hx    Stomach cancer Neg Hx     Current Outpatient Medications  (Endocrine & Metabolic):    levothyroxine (SYNTHROID) 25 MCG tablet, Take 1 tablet (25 mcg total) by mouth daily.   LO LOESTRIN FE 1 MG-10 MCG / 10 MCG tablet, Take 1 tablet by mouth daily.      Current Outpatient Medications (Other):  Azelaic Acid (FINACEA) 15 % gel, Apply topically 2 (two) times daily. After skin is thoroughly washed and patted dry, gently but thoroughly massage a thin film of azelaic acid cream into the affected area twice daily, in the morning and evening.   DULoxetine (CYMBALTA) 20 MG capsule, Take 1 capsule (20 mg total) by mouth daily.   Multiple Vitamin (DAILY VITAMIN) tablet, Take 1 tablet by mouth daily. Take 1 tablet by mouth.   VITAMIN D-VITAMIN K PO, Take by mouth. 4000/100   Reviewed prior external information including notes and imaging from  primary care provider As well as notes that were available from care everywhere and other healthcare systems.  Past medical history, social, surgical and family history all reviewed in electronic medical record.  No pertanent information unless stated regarding to the chief complaint.   Review of Systems:  No headache, visual changes, nausea, vomiting, diarrhea, constipation, dizziness, abdominal pain, skin rash, fevers, chills, night sweats, weight loss, swollen lymph nodes, body aches, joint swelling, chest pain, shortness of breath, mood changes. POSITIVE muscle aches  Objective  Blood pressure 108/78, pulse 79, height 5\' 7"  (1.702 m), weight 176 lb (79.8 kg), SpO2 99%.   General: No apparent distress alert and oriented x3 mood and affect normal, dressed appropriately.  HEENT: Pupils equal, extraocular movements intact  Respiratory: Patient's speak in full sentences and does not appear short of breath  Cardiovascular: No lower extremity edema, non tender, no erythema  Patient does have swelling and decreased range of motion of multiple joints noted today.  The patient's left ankle and does still have still some  tenderness over the peroneal tendon.  The patient though does not have any audible popping.  Improvement in range of motion of this.  Patient notes back does have more tightness noted.  Limited muscular skeletal ultrasound was performed and interpreted by Antoine Primas, M  Patient does have significant less hypoechoic changes within the tendon of the peroneal tendon from previous exam.  Patient though does unfortunately still have what appears to be may be a potential rheumatoid nodule in the tendon sheath.  This is just inferior to the lateral malleolus. Impression: Interval improvement    Impression and Recommendations:    Left peroneal tendonosis Improvement noted at this time.  Discussed icing regimen and home exercises, discussed which activities to do to avoid.  Increase activity slowly.  Discussed icing regimen.  Follow-up again in 6 to 8 weeks.  Polyarthralgia I believe the patient is having a flare.  Has some metric of inflammation of multiple joints including hips, sacroiliac joints, bilateral shoulders and even the occipital area.  Did not respond as well to osteopathic manipulation as usual.  Tylenol and Depo-Medrol injections given today.  Very hopeful that patient will make improvement.  Discussed icing regimen, patient is concerned that some of the pain could be secondary to the levothyroxine.  Would retest with patient being off the medication that may be in April.  Follow-up again in 6 to 8 weeks otherwise  Hypothyroidism due to Hashimoto thyroiditis Patient wants to hold on anything such as the medication at this time.  We discussed if this does not seem to help and we need to consider Armour.   The above documentation has been reviewed and is accurate and complete Judi Saa, DO

## 2023-04-25 ENCOUNTER — Other Ambulatory Visit: Payer: Self-pay

## 2023-04-25 ENCOUNTER — Ambulatory Visit: Payer: BC Managed Care – PPO | Admitting: Family Medicine

## 2023-04-25 ENCOUNTER — Encounter: Payer: Self-pay | Admitting: Family Medicine

## 2023-04-25 VITALS — BP 108/78 | HR 79 | Ht 67.0 in | Wt 176.0 lb

## 2023-04-25 DIAGNOSIS — M2550 Pain in unspecified joint: Secondary | ICD-10-CM

## 2023-04-25 DIAGNOSIS — E063 Autoimmune thyroiditis: Secondary | ICD-10-CM

## 2023-04-25 DIAGNOSIS — M6788 Other specified disorders of synovium and tendon, other site: Secondary | ICD-10-CM | POA: Diagnosis not present

## 2023-04-25 MED ORDER — METHYLPREDNISOLONE ACETATE 80 MG/ML IJ SUSP
80.0000 mg | Freq: Once | INTRAMUSCULAR | Status: AC
  Administered 2023-04-25: 80 mg via INTRAMUSCULAR

## 2023-04-25 MED ORDER — KETOROLAC TROMETHAMINE 60 MG/2ML IM SOLN
60.0000 mg | Freq: Once | INTRAMUSCULAR | Status: AC
  Administered 2023-04-25: 60 mg via INTRAMUSCULAR

## 2023-04-25 NOTE — Assessment & Plan Note (Signed)
 I believe the patient is having a flare.  Has some metric of inflammation of multiple joints including hips, sacroiliac joints, bilateral shoulders and even the occipital area.  Did not respond as well to osteopathic manipulation as usual.  Tylenol and Depo-Medrol injections given today.  Very hopeful that patient will make improvement.  Discussed icing regimen, patient is concerned that some of the pain could be secondary to the levothyroxine.  Would retest with patient being off the medication that may be in April.  Follow-up again in 6 to 8 weeks otherwise

## 2023-04-25 NOTE — Patient Instructions (Addendum)
 Okay stop sythroid 6-8 weeks

## 2023-04-25 NOTE — Assessment & Plan Note (Signed)
 Improvement noted at this time.  Discussed icing regimen and home exercises, discussed which activities to do to avoid.  Increase activity slowly.  Discussed icing regimen.  Follow-up again in 6 to 8 weeks.

## 2023-04-25 NOTE — Assessment & Plan Note (Signed)
 Patient wants to hold on anything such as the medication at this time.  We discussed if this does not seem to help and we need to consider Armour.

## 2023-04-30 DIAGNOSIS — M9904 Segmental and somatic dysfunction of sacral region: Secondary | ICD-10-CM | POA: Diagnosis not present

## 2023-04-30 DIAGNOSIS — M5127 Other intervertebral disc displacement, lumbosacral region: Secondary | ICD-10-CM | POA: Diagnosis not present

## 2023-04-30 DIAGNOSIS — M9905 Segmental and somatic dysfunction of pelvic region: Secondary | ICD-10-CM | POA: Diagnosis not present

## 2023-04-30 DIAGNOSIS — M9901 Segmental and somatic dysfunction of cervical region: Secondary | ICD-10-CM | POA: Diagnosis not present

## 2023-05-05 ENCOUNTER — Ambulatory Visit: Payer: Self-pay | Admitting: Family Medicine

## 2023-05-05 DIAGNOSIS — G8929 Other chronic pain: Secondary | ICD-10-CM

## 2023-05-05 DIAGNOSIS — M79645 Pain in left finger(s): Secondary | ICD-10-CM

## 2023-05-05 NOTE — Progress Notes (Signed)
   [facility] [address] [place] [address] Phone: [phone]   Extracorporeal Shockwave Therapy Note    Patient is being treated today with ECSWT. Informed consent was obtained and patient tolerated procedure well.   Continuation of shockwave treatment last performed February 11 Therapy performed by Clementeen Graham  Condition treated: Left hand and wrist Treatment preset used: Modification low-power Energy used: 20 mJ at [facility] and over TFCC and 10 mJ at thumb MCP Frequency used: 15 Hz Number of pulses: 3000 total Head Size: Large Treatment #7 of #9  Electronically signed by:  [facility] 8:47 AM 05/05/23  If not better after this round of shockwave would recommend MRI arthrogram of the wrist

## 2023-05-08 DIAGNOSIS — N611 Abscess of the breast and nipple: Secondary | ICD-10-CM | POA: Diagnosis not present

## 2023-05-08 DIAGNOSIS — N6001 Solitary cyst of right breast: Secondary | ICD-10-CM | POA: Diagnosis not present

## 2023-05-08 DIAGNOSIS — N6311 Unspecified lump in the right breast, upper outer quadrant: Secondary | ICD-10-CM | POA: Diagnosis not present

## 2023-05-12 ENCOUNTER — Ambulatory Visit: Payer: Self-pay | Admitting: Family Medicine

## 2023-05-12 DIAGNOSIS — M79645 Pain in left finger(s): Secondary | ICD-10-CM

## 2023-05-12 DIAGNOSIS — G8929 Other chronic pain: Secondary | ICD-10-CM

## 2023-05-12 NOTE — Progress Notes (Signed)
   [facility] [address] [place] [address] Phone: [phone]   Extracorporeal Shockwave Therapy Note    Patient is being treated today with ECSWT. Informed consent was obtained and patient tolerated procedure well.   Therapy performed by Clementeen Graham  Condition treated: Left hand and wrist pain Treatment preset used: Modification low-power Energy used: 20 mJ Frequency used: 15 hz Number of pulses: 3000 total 2000 at thumb and 1000 at ulnar wrist Head Size: Large Treatment #8 of #9  Electronically signed by:  [facility] 10:08 AM 05/12/23

## 2023-05-15 ENCOUNTER — Encounter: Payer: Self-pay | Admitting: Emergency Medicine

## 2023-05-15 ENCOUNTER — Ambulatory Visit: Payer: BC Managed Care – PPO | Admitting: Emergency Medicine

## 2023-05-15 ENCOUNTER — Telehealth: Payer: Self-pay | Admitting: Primary Care

## 2023-05-15 VITALS — BP 112/68 | HR 74 | Ht 67.0 in | Wt 176.6 lb

## 2023-05-15 DIAGNOSIS — R911 Solitary pulmonary nodule: Secondary | ICD-10-CM | POA: Diagnosis not present

## 2023-05-15 NOTE — Assessment & Plan Note (Signed)
 2 right upper lobe pulmonary nodules.  Reviewed the differential diagnosis with her.  She is low risk patient for malignancy, does he have a new diagnosis of autoimmune disease.  Consider a connection to her autoimmune inflammation.  We will repeat her CT scan of the chest in October which would be the 1 year mark.

## 2023-05-15 NOTE — Patient Instructions (Signed)
 We reviewed your CT scan of the chest today. We will plan to repeat your CT chest in October 2025. Follow Dr. Delton Coombes in October after that CT so we can review the results together. Please call sooner if you develop any new respiratory symptoms or problems with your breathing.

## 2023-05-15 NOTE — Telephone Encounter (Signed)
 Nothing further needed. Front staff had wrong pt attached to this message.

## 2023-05-15 NOTE — Telephone Encounter (Signed)
 Patient presents to the front to check out. Patient need a 1 hour PFT before her surgery on April 4. We do not have availability for 1 hour at [facility] or [facility]. Can this be scheduled at [facility]? Patient is willing to travel within a 50 mile radius.Please contact patient husband [phone] with appointment when scheduled.

## 2023-05-15 NOTE — Telephone Encounter (Signed)
 Wrong Patient disreguard

## 2023-05-15 NOTE — Progress Notes (Signed)
 Subjective:    Patient ID: Karen Short, female    DOB: [birth year], 58 y.o.   MRN: [id number]  HPI 58 year old woman, former smoker (10+ pack years) with a history of anxiety, polyarthralgia and possible rheumatoid arthritis, hypothyroidism due to Hashimoto's, allergic rhinitis, iron deficiency anemia. She underwent a cardiac scoring Ct chest 11/2022 that showed a nodule, prompting CT chest as below.  She had left breast abnormalities that have been evaluated already and which were benign Overall feeling well, denies any dyspnea, cough  CT chest 12/03/2022 reviewed by me shows 4 mm solid right upper lobe pulmonary nodule, 6 mm perifissural solid right upper lobe nodule.  There were 2 soft tissue rounded left breast opacities   Review of Systems As per HPI  Past Medical History:  Diagnosis Date   Arthritis    Broken bones    scaphoid and elbow   Closed nondisplaced fracture of scaphoid of left wrist 10/24/2022   Endometrial polyp    GAD (generalized anxiety disorder)    History of frequent urinary tract infections    Iron deficiency anemia    Osteopenia    Polyarthralgia    06-24-2022  per pt just tested positive RA this past friday 06-21-2022, test done by orthopedic   PONV (postoperative nausea and vomiting)    Seasonal allergies    Somatic dysfunction of back    Uterine fibroid    Wears glasses      Family History  Problem Relation Age of Onset   Kidney disease Mother    Miscarriages / [place] Mother    COPD Mother    Cancer Mother        skin   Arthritis Mother    Hyperlipidemia Mother    Heart disease Mother    Stroke Mother    Hypertension Mother    Diabetes Mother    Heart attack Mother    Heart attack Father    Hearing loss Father    Cancer Father        skin   Arthritis Father    Heart disease Father    Stroke Father    Hypertension Father    Asthma Brother    Hearing loss Brother    Arthritis Brother    Breast cancer Maternal Aunt     Breast cancer Paternal Aunt    Colon polyps Neg Hx    Colon cancer Neg Hx    Esophageal cancer Neg Hx    Rectal cancer Neg Hx    Stomach cancer Neg Hx     No lung CA in the family  Social History   Socioeconomic History   Marital status: Legally Separated    Spouse name: Not on file   Number of children: Not on file   Years of education: Not on file   Highest education level: Master's degree (e.g., MA, MS, MEng, MEd, MSW, MBA)  Occupational History   Not on file  Tobacco Use   Smoking status: Former    Current packs/day: 0.00    Types: Cigarettes    Start date: 68    Quit date: 1987    Years since quitting: 38.2    Passive exposure: Past   Smokeless tobacco: Never  Vaping Use   Vaping status: Never Used  Substance and Sexual Activity   Alcohol use: Yes    Alcohol/week: 4.0 - 5.0 standard drinks of alcohol    Types: 4 - 5 Standard drinks or equivalent per week   Drug use:  Never   Sexual activity: Not Currently    Partners: Female    Birth control/protection: None, Post-menopausal  Other Topics Concern   Not on file  Social History Narrative   Marital status/children/pets: Divorced, G0P0   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[employer]   Safety:         -smoke alarm in the home:Yes     - wears seatbelt: Yes     - Feels safe in their relationships: Yes      Social Drivers of Corporate investment banker Strain: Low Risk  (08/07/2021)   Overall Financial Resource Strain (CARDIA)    Difficulty of Paying Living Expenses: Not hard at all  Food Insecurity: No Food Insecurity (08/07/2021)   Hunger Vital Sign    Worried About Running Out of Food in the Last Year: Never true    Ran Out of Food in the Last Year: Never true  Transportation Needs: No Transportation Needs (08/07/2021)   PRAPARE - Administrator, Civil Service (Medical): No    Lack of Transportation (Non-Medical): No  Physical Activity: Sufficiently Active (08/07/2021)   Exercise Vital Sign    Days of  Exercise per Week: 5 days    Minutes of Exercise per Session: 40 min  Stress: Stress Concern Present (08/07/2021)   [employer] of Occupational Health - Occupational Stress Questionnaire    Feeling of Stress : To some extent  Social Connections: Socially Isolated (08/07/2021)   Social Connection and Isolation Panel [NHANES]    Frequency of Communication with Friends and Family: Once a week    Frequency of Social Gatherings with Friends and Family: Once a week    Attends Religious Services: Never    Database administrator or Organizations: No    Attends Engineer, structural: Not on file    Marital Status: Separated  Intimate Partner Violence: Not on file    Works logistics, 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   Latex Rash     Outpatient Medications Prior to Visit  Medication Sig Dispense Refill   Azelaic Acid (FINACEA) 15 % gel Apply topically 2 (two) times daily. After skin is thoroughly washed and patted dry, gently but thoroughly massage a thin film of azelaic acid cream into the affected area twice daily, in the morning and evening.     DULoxetine (CYMBALTA) 20 MG capsule Take 1 capsule (20 mg total) by mouth daily. 90 capsule 1   levothyroxine (SYNTHROID) 25 MCG tablet Take 1 tablet (25 mcg total) by mouth daily. 90 tablet 0   Multiple Vitamin (DAILY VITAMIN) tablet Take 1 tablet by mouth daily. Take 1 tablet by mouth.     VITAMIN D-VITAMIN K PO Take by mouth. 4000/100     LO LOESTRIN FE 1 MG-10 MCG / 10 MCG tablet Take 1 tablet by mouth daily.     No facility-administered medications prior to visit.        Objective:   Physical Exam Vitals:   05/15/23 1031  BP: 112/68  Pulse: 74  SpO2: 99%  Weight: 176 lb 9.6 oz (80.1 kg)  Height: 5\' 7"  (1.702 m)   Gen: Pleasant, well-nourished, in no distress,  normal affect  ENT: No lesions,   mouth clear,  oropharynx clear, no postnasal drip  Neck: No JVD, no stridor  Lungs: No use of accessory muscles, no  crackles or wheezing on normal respiration, no wheeze on forced expiration  Cardiovascular: RRR, heart sounds normal, no murmur or gallops, no peripheral edema  Musculoskeletal: No deformities, no cyanosis or clubbing  Neuro: alert, awake, non focal  Skin: Warm, no lesions or rash       Assessment & Plan:  Pulmonary nodule 2 right upper lobe pulmonary nodules.  Reviewed the differential diagnosis with her.  She is low risk patient for malignancy, does he have a new diagnosis of autoimmune disease.  Consider a connection to her autoimmune inflammation.  We will repeat her CT scan of the chest in October which would be the 1 year mark.   Levy Pupa, MD, PhD 05/15/2023, 4:54 PM [facility] [phone] or if no answer before 7:00PM call [phone] For any issues after 7:00PM please call eLink [phone]

## 2023-05-19 ENCOUNTER — Ambulatory Visit: Payer: Self-pay | Admitting: Family Medicine

## 2023-05-19 ENCOUNTER — Encounter: Payer: Self-pay | Admitting: Family Medicine

## 2023-05-19 DIAGNOSIS — M9901 Segmental and somatic dysfunction of cervical region: Secondary | ICD-10-CM | POA: Diagnosis not present

## 2023-05-19 DIAGNOSIS — M79645 Pain in left finger(s): Secondary | ICD-10-CM

## 2023-05-19 DIAGNOSIS — G8929 Other chronic pain: Secondary | ICD-10-CM

## 2023-05-19 DIAGNOSIS — M9905 Segmental and somatic dysfunction of pelvic region: Secondary | ICD-10-CM | POA: Diagnosis not present

## 2023-05-19 DIAGNOSIS — M5127 Other intervertebral disc displacement, lumbosacral region: Secondary | ICD-10-CM | POA: Diagnosis not present

## 2023-05-19 DIAGNOSIS — M9904 Segmental and somatic dysfunction of sacral region: Secondary | ICD-10-CM | POA: Diagnosis not present

## 2023-05-19 NOTE — Progress Notes (Signed)
   [facility] [address] [place] [address] Phone: [phone]   Extracorporeal Shockwave Therapy Note    Patient is being treated today with ECSWT. Informed consent was obtained and patient tolerated procedure well.   Therapy performed by Clementeen Graham  Condition treated: Left thumb and ulnar wrist Treatment preset used: Modification low-power Energy used: 20 mJ Frequency used: 15 Hz Number of pulses: 3000 total.  2000 thumb 1000 ulnar wrist. Head Size: Large Treatment #9 of #9.  Could extend if needed.  Electronically signed by:  [facility] 8:48 AM 05/19/23

## 2023-06-06 ENCOUNTER — Other Ambulatory Visit: Payer: Self-pay | Admitting: Family Medicine

## 2023-06-12 NOTE — Progress Notes (Signed)
 [facility] [address] [place] [address] Phone: [phone] Subjective:   Karen Short, am serving as a scribe for Dr. Ronnell Coins.  I'm seeing this patient by the request  of:  Kuneff, Renee A, DO  CC: left thumb and left ankle pain   [id number]  05/15/2023 Patient wants to hold on anything such as the medication at this time. We discussed if this does not seem to help and we need to consider Armour.   I believe the patient is having a flare.  Has some metric of inflammation of multiple joints including hips, sacroiliac joints, bilateral shoulders and even the occipital area.  Did not respond as well to osteopathic manipulation as usual.  Tylenol  and Depo-Medrol  injections given today.  Very hopeful that patient will make improvement.  Discussed icing regimen, patient is concerned that some of the pain could be secondary to the levothyroxine .  Would retest with patient being off the medication that may be in April.  Follow-up again in 6 to 8 weeks otherwise      Update 06/18/2023 Karen Short [facility] is a 58 y.o. female coming in with complaint of L thumb and L ankle pain. Patient states both have improved about 95%. Gets catching every now and again, but minimal. Rarely hurts only on the ular side she may feel something.      Past Medical History:  Diagnosis Date   Arthritis    Broken bones    scaphoid and elbow   Closed nondisplaced fracture of scaphoid of left wrist 10/24/2022   Endometrial polyp    GAD (generalized anxiety disorder)    History of frequent urinary tract infections    Iron deficiency anemia    Osteopenia    Polyarthralgia    06-24-2022  per pt just tested positive RA this past friday 06-21-2022, test done by orthopedic   PONV (postoperative nausea and vomiting)    Seasonal allergies    Somatic dysfunction of back    Uterine fibroid    Wears glasses    Past Surgical History:  Procedure Laterality Date    BREAST LUMPECTOMY Left 2008   benign;2018/2022/2023   COLONOSCOPY  2019   DILATATION & CURETTAGE/HYSTEROSCOPY WITH MYOSURE N/A 06/28/2022   Procedure: DILATATION & CURETTAGE/HYSTEROSCOPY WITH MYOSURE;  Surgeon: Merryl Abraham, MD;  Location: [facility];  Service: Gynecology;  Laterality: N/A;   HYSTEROSCOPY WITH D & C  09/2021   06/2022   KNEE ARTHROSCOPY W/ MENISCAL REPAIR Right 2014   LAPAROSCOPIC GELPORT ASSISTED MYOMECTOMY  2011   TONSILLECTOMY     child-1973   Social History   Socioeconomic History   Marital status: Legally Separated    Spouse name: Not on file   Number of children: Not on file   Years of education: Not on file   Highest education level: Master's degree (e.g., MA, MS, MEng, MEd, MSW, MBA)  Occupational History   Not on file  Tobacco Use   Smoking status: Former    Current packs/day: 0.00    Types: Cigarettes    Start date: 61    Quit date: 1987    Years since quitting: 38.3    Passive exposure: Past   Smokeless tobacco: Never  Vaping Use   Vaping status: Never Used  Substance and Sexual Activity   Alcohol use: Yes    Alcohol/week: 4.0 - 5.0 standard drinks of alcohol    Types: 4 - 5 Standard drinks or equivalent per  week   Drug use: Never   Sexual activity: Not Currently    Partners: Female    Birth control/protection: None, Post-menopausal  Other Topics Concern   Not on file  Social History Narrative   Marital status/children/pets: Divorced, G0P0   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ly chain   Safety:         -smoke alarm in the home:Yes     - wears seatbelt: Yes     - Feels safe in their relationships: Yes      Social Drivers of Corporate investment banker Strain: Low Risk  (08/07/2021)   Overall Financial Resource Strain (CARDIA)    Difficulty of Paying Living Expenses: Not hard at all  Food Insecurity: No Food Insecurity (08/07/2021)   Hunger Vital Sign    Worried About Running Out of Food in the Last Year: Never true     Ran Out of Food in the Last Year: Never true  Transportation Needs: No Transportation Needs (08/07/2021)   PRAPARE - Administrator, Civil Service (Medical): No    Lack of Transportation (Non-Medical): No  Physical Activity: Sufficiently Active (08/07/2021)   Exercise Vital Sign    Days of Exercise per Week: 5 days    Minutes of Exercise per Session: 40 min  Stress: Stress Concern Present (08/07/2021)   [employer] of Occupational Health - Occupational Stress Questionnaire    Feeling of Stress : To some extent  Social Connections: Socially Isolated (08/07/2021)   Social Connection and Isolation Panel [NHANES]    Frequency of Communication with Friends and Family: Once a week    Frequency of Social Gatherings with Friends and Family: Once a week    Attends Religious Services: Never    Database administrator or Organizations: No    Attends Engineer, structural: Not on file    Marital Status: Separated   Allergies  Allergen Reactions   Epinephrine     Lightheaded, things spin and "start to go dark", rapid HR per patient.   Latex Rash   Family History  Problem Relation Age of Onset   Kidney disease Mother    Miscarriages / [place] Mother    COPD Mother    Cancer Mother        skin   Arthritis Mother    Hyperlipidemia Mother    Heart disease Mother    Stroke Mother    Hypertension Mother    Diabetes Mother    Heart attack Mother    Heart attack Father    Hearing loss Father    Cancer Father        skin   Arthritis Father    Heart disease Father    Stroke Father    Hypertension Father    Asthma Brother    Hearing loss Brother    Arthritis Brother    Breast cancer Maternal Aunt    Breast cancer Paternal Aunt    Colon polyps Neg Hx    Colon cancer Neg Hx    Esophageal cancer Neg Hx    Rectal cancer Neg Hx    Stomach cancer Neg Hx     Current Outpatient Medications (Endocrine & Metabolic):    levothyroxine  (SYNTHROID ) 25 MCG tablet, TAKE  1 TABLET BY MOUTH EVERY DAY   LO LOESTRIN FE 1 MG-10 MCG / 10 MCG tablet, Take 1 tablet by mouth daily.      Current Outpatient Medications (Other):    Azelaic Acid (FINACEA) 15 % gel, Apply  topically 2 (two) times daily. After skin is thoroughly washed and patted dry, gently but thoroughly massage a thin film of azelaic acid cream into the affected area twice daily, in the morning and evening.   DULoxetine  (CYMBALTA ) 20 MG capsule, Take 1 capsule (20 mg total) by mouth daily.   Multiple Vitamin (DAILY VITAMIN) tablet, Take 1 tablet by mouth daily. Take 1 tablet by mouth.   VITAMIN D -VITAMIN K PO, Take by mouth. 4000/100   Reviewed prior external information including notes and imaging from  primary care provider As well as notes that were available from care everywhere and other healthcare systems.  Past medical history, social, surgical and family history all reviewed in electronic medical record.  No pertanent information unless stated regarding to the chief complaint.   Review of Systems:  No headache, visual changes, nausea, vomiting, diarrhea, constipation, dizziness, abdominal pain, skin rash, fevers, chills, night sweats, weight loss, swollen lymph nodes, body aches, joint swelling, chest pain, shortness of breath, mood changes. POSITIVE muscle aches  Objective  Blood pressure 116/70, pulse 96, height 5\' 7"  (1.702 m), SpO2 98%.   General: No apparent distress alert and oriented x3 mood and affect normal, dressed appropriately.  HEENT: Pupils equal, extraocular movements intact  Respiratory: Patient's speak in full sentences and does not appear short of breath  Cardiovascular: No lower extremity edema, non tender, no erythema  Left thumb exam shows overall doing relatively well.  Improvement in range of motion.  Left ankle pain exam shows left ankle does have some tenderness to palpation more over the peroneal tendons still.   Limited muscular skeletal ultrasound was  performed and interpreted by Ronnell Coins, M  Limited ultrasound shows patient does have some hypoechoic changes of the [facility] joint but none of the calcific changes noted anywhere at this time.  Patient does have some mild hypoechoic changes in the ECU on the contralateral side of the wrist.  Osteopathic findings C2 flexed rotated and side bent right C4 flexed rotated and side bent left C6 flexed rotated and side bent left T3 extended rotated and side bent right inhaled third rib T9 extended rotated and side bent left L2 flexed rotated and side bent right Sacrum right on right    Impression and Recommendations:    Decision today to treat with OMT was based on Physical Exam  After verbal consent patient was treated with HVLA, ME, FPR techniques in cervical, thoracic, rib, lumbar and sacral areas, all areas are chronic   Patient tolerated the procedure well with improvement in symptoms  Patient given exercises, stretches and lifestyle modifications  See medications in patient instructions if given  Patient will follow up in 4-8 weeks  Right hip pain Continues to have some pain in the hip as well as the sacroiliac joint.  Responding relatively well though to osteopathic manipulation.  Discussed icing regimen and home exercises, discussed posture and ergonomics.  Responds well to osteopathic manipulation.  Follow-up with me again in 6 to 8 weeks    The above documentation has been reviewed and is accurate and complete Jerell Demery M Mahagony Grieb, DO

## 2023-06-16 DIAGNOSIS — M9901 Segmental and somatic dysfunction of cervical region: Secondary | ICD-10-CM | POA: Diagnosis not present

## 2023-06-16 DIAGNOSIS — M9905 Segmental and somatic dysfunction of pelvic region: Secondary | ICD-10-CM | POA: Diagnosis not present

## 2023-06-16 DIAGNOSIS — M5127 Other intervertebral disc displacement, lumbosacral region: Secondary | ICD-10-CM | POA: Diagnosis not present

## 2023-06-16 DIAGNOSIS — M8580 Other specified disorders of bone density and structure, unspecified site: Secondary | ICD-10-CM | POA: Diagnosis not present

## 2023-06-16 DIAGNOSIS — M9904 Segmental and somatic dysfunction of sacral region: Secondary | ICD-10-CM | POA: Diagnosis not present

## 2023-06-17 ENCOUNTER — Ambulatory Visit: Payer: BC Managed Care – PPO | Admitting: Internal Medicine

## 2023-06-18 ENCOUNTER — Encounter: Payer: Self-pay | Admitting: Family Medicine

## 2023-06-18 ENCOUNTER — Ambulatory Visit: Admitting: Family Medicine

## 2023-06-18 ENCOUNTER — Other Ambulatory Visit: Payer: Self-pay

## 2023-06-18 VITALS — BP 116/70 | HR 96 | Ht 67.0 in

## 2023-06-18 DIAGNOSIS — G8929 Other chronic pain: Secondary | ICD-10-CM

## 2023-06-18 DIAGNOSIS — M9908 Segmental and somatic dysfunction of rib cage: Secondary | ICD-10-CM | POA: Diagnosis not present

## 2023-06-18 DIAGNOSIS — M79645 Pain in left finger(s): Secondary | ICD-10-CM | POA: Diagnosis not present

## 2023-06-18 DIAGNOSIS — N950 Postmenopausal bleeding: Secondary | ICD-10-CM | POA: Diagnosis not present

## 2023-06-18 DIAGNOSIS — M9902 Segmental and somatic dysfunction of thoracic region: Secondary | ICD-10-CM

## 2023-06-18 DIAGNOSIS — M25551 Pain in right hip: Secondary | ICD-10-CM | POA: Diagnosis not present

## 2023-06-18 DIAGNOSIS — N951 Menopausal and female climacteric states: Secondary | ICD-10-CM | POA: Diagnosis not present

## 2023-06-18 DIAGNOSIS — M9901 Segmental and somatic dysfunction of cervical region: Secondary | ICD-10-CM | POA: Diagnosis not present

## 2023-06-18 DIAGNOSIS — N6001 Solitary cyst of right breast: Secondary | ICD-10-CM | POA: Diagnosis not present

## 2023-06-18 DIAGNOSIS — M9904 Segmental and somatic dysfunction of sacral region: Secondary | ICD-10-CM

## 2023-06-18 DIAGNOSIS — M9903 Segmental and somatic dysfunction of lumbar region: Secondary | ICD-10-CM | POA: Diagnosis not present

## 2023-06-18 NOTE — Assessment & Plan Note (Signed)
 Continues to have some pain in the hip as well as the sacroiliac joint.  Responding relatively well though to osteopathic manipulation.  Discussed icing regimen and home exercises, discussed posture and ergonomics.  Responds well to osteopathic manipulation.  Follow-up with me again in 6 to 8 weeks

## 2023-06-18 NOTE — Patient Instructions (Signed)
 Good to see you! [place] for lifting Keep watching the back Do prescribed exercises at least 3x a week  See you again in 6 weeks

## 2023-07-11 DIAGNOSIS — M9904 Segmental and somatic dysfunction of sacral region: Secondary | ICD-10-CM | POA: Diagnosis not present

## 2023-07-11 DIAGNOSIS — M9901 Segmental and somatic dysfunction of cervical region: Secondary | ICD-10-CM | POA: Diagnosis not present

## 2023-07-11 DIAGNOSIS — M9905 Segmental and somatic dysfunction of pelvic region: Secondary | ICD-10-CM | POA: Diagnosis not present

## 2023-07-11 DIAGNOSIS — M5127 Other intervertebral disc displacement, lumbosacral region: Secondary | ICD-10-CM | POA: Diagnosis not present

## 2023-07-15 DIAGNOSIS — N950 Postmenopausal bleeding: Secondary | ICD-10-CM | POA: Diagnosis not present

## 2023-07-18 ENCOUNTER — Ambulatory Visit: Payer: BC Managed Care – PPO | Admitting: Family Medicine

## 2023-07-21 DIAGNOSIS — M5127 Other intervertebral disc displacement, lumbosacral region: Secondary | ICD-10-CM | POA: Diagnosis not present

## 2023-07-21 DIAGNOSIS — M9905 Segmental and somatic dysfunction of pelvic region: Secondary | ICD-10-CM | POA: Diagnosis not present

## 2023-07-21 DIAGNOSIS — M9904 Segmental and somatic dysfunction of sacral region: Secondary | ICD-10-CM | POA: Diagnosis not present

## 2023-07-21 DIAGNOSIS — M9901 Segmental and somatic dysfunction of cervical region: Secondary | ICD-10-CM | POA: Diagnosis not present

## 2023-08-01 NOTE — Progress Notes (Signed)
 [facility] [address] [place] [address] Phone: [phone] Subjective:   Karen Short, am serving as a scribe for Dr. Arthea Claudene.  I'm seeing this patient by the request  of:  Kuneff, Renee A, DO  CC: Right hip and left thumb pain  YEP:Dlagzrupcz  Karen Short is a 58 y.o. female coming in with complaint of back and neck pain. OMT 06/18/2023. Also f/u for R hip and L thumb pain. Patient states doing better with thumb. L side SI pain doing through hamstring.  Medications patient has been prescribed: None  Taking:         Reviewed prior external information including notes and imaging from previsou exam, outside providers and external EMR if available.   As well as notes that were available from care everywhere and other healthcare systems.  Past medical history, social, surgical and family history all reviewed in electronic medical record.  No pertanent information unless stated regarding to the chief complaint.   Past Medical History:  Diagnosis Date   Arthritis    Broken bones    scaphoid and elbow   Closed nondisplaced fracture of scaphoid of left wrist 10/24/2022   Endometrial polyp    GAD (generalized anxiety disorder)    History of frequent urinary tract infections    Iron deficiency anemia    Osteopenia    Polyarthralgia    06-24-2022  per pt just tested positive RA this past friday 06-21-2022, test done by orthopedic   PONV (postoperative nausea and vomiting)    Seasonal allergies    Somatic dysfunction of back    Uterine fibroid    Wears glasses     Allergies  Allergen Reactions   Epinephrine     Lightheaded, things spin and start to go dark, rapid HR per patient.   Latex Rash     Review of Systems:  No headache, visual changes, nausea, vomiting, diarrhea, constipation, dizziness, abdominal pain, skin rash, fevers, chills, night sweats, weight loss, swollen lymph nodes, body aches, joint  swelling, chest pain, shortness of breath, mood changes. POSITIVE muscle aches  Objective  Blood pressure 110/68, pulse 83, height 5' 7 (1.702 m), weight 173 lb (78.5 kg), SpO2 98%.   General: No apparent distress alert and oriented x3 mood and affect normal, dressed appropriately.  HEENT: Pupils equal, extraocular movements intact  Respiratory: Patient's speak in full sentences and does not appear Short of breath  Cardiovascular: No lower extremity edema, non tender, no erythema  Gait relatively normal MSK:  Back pain in the parascapular area.  Tightness in the lower back more on the left sacroiliac joint as well.  Tightness in the left sacroiliac joint and tightness of the left hamstring.  Negative straight leg test noted otherwise.  Multiple trigger points noted to left shoulder joint.  After verbal consent prepped with alcohol swab and with a 25-gauge half inch needle injected with 3 cc of 0.5% Marcaine and 1 cc of Kenalog 40 mg/mL into the left shoulder region in the trapezius, levator scapula and rhomboid.  Minimal blood loss.  Band-Aid placed.  Postinjection instructions given  Osteopathic findings  C2 flexed rotated and side bent right C6 flexed rotated and side bent left T3 extended rotated and side bent right inhaled rib T9 extended rotated and side bent left L2 flexed rotated and side bent right L3 flexed rotated and side bent left Sacrum right on right       Assessment and Plan:  Right hip pain Usually right-sided but this time more left-sided.  Still seems to be sacroiliac joint.  Did have bruising from her recent massage.  Thinks that is potentially contributing to some of the discomfort and pain as well.  Discussed with patient that icing regimen and home exercises, increase activity slowly.  Discussed icing regimen.  Follow-up again in 6 to 8 weeks    Nonallopathic problems  Decision today to treat with OMT was based on Physical Exam  After verbal consent  patient was treated with HVLA, ME, FPR techniques in cervical, rib, thoracic, lumbar, and sacral  areas  Patient tolerated the procedure well with improvement in symptoms  Patient given exercises, stretches and lifestyle modifications  See medications in patient instructions if given  Patient will follow up in 4-8 weeks    The above documentation has been reviewed and is accurate and complete Karen Short M Esha Fincher, DO          Note: This dictation was prepared with Dragon dictation along with smaller phrase technology. Any transcriptional errors that result from this process are unintentional.

## 2023-08-05 ENCOUNTER — Encounter: Payer: Self-pay | Admitting: Family Medicine

## 2023-08-05 ENCOUNTER — Ambulatory Visit: Admitting: Family Medicine

## 2023-08-05 VITALS — BP 110/68 | HR 83 | Ht 67.0 in | Wt 173.0 lb

## 2023-08-05 DIAGNOSIS — M25512 Pain in left shoulder: Secondary | ICD-10-CM

## 2023-08-05 DIAGNOSIS — S76312A Strain of muscle, fascia and tendon of the posterior muscle group at thigh level, left thigh, initial encounter: Secondary | ICD-10-CM

## 2023-08-05 DIAGNOSIS — M9902 Segmental and somatic dysfunction of thoracic region: Secondary | ICD-10-CM

## 2023-08-05 DIAGNOSIS — M25551 Pain in right hip: Secondary | ICD-10-CM | POA: Diagnosis not present

## 2023-08-05 DIAGNOSIS — M9901 Segmental and somatic dysfunction of cervical region: Secondary | ICD-10-CM | POA: Diagnosis not present

## 2023-08-05 DIAGNOSIS — M9908 Segmental and somatic dysfunction of rib cage: Secondary | ICD-10-CM | POA: Diagnosis not present

## 2023-08-05 DIAGNOSIS — M9903 Segmental and somatic dysfunction of lumbar region: Secondary | ICD-10-CM

## 2023-08-05 DIAGNOSIS — M9904 Segmental and somatic dysfunction of sacral region: Secondary | ICD-10-CM

## 2023-08-05 NOTE — Patient Instructions (Signed)
 Trigger point injections today Good to see you! See you again in 6-8 weeks Thigh compression with activity Heel lifts with walking

## 2023-08-05 NOTE — Assessment & Plan Note (Signed)
 Usually right-sided but this time more left-sided.  Still seems to be sacroiliac joint.  Did have bruising from her recent massage.  Thinks that is potentially contributing to some of the discomfort and pain as well.  Discussed with patient that icing regimen and home exercises, increase activity slowly.  Discussed icing regimen.  Follow-up again in 6 to 8 weeks

## 2023-08-05 NOTE — Assessment & Plan Note (Signed)
 Trigger point injections given today, tolerated the procedure well, discussed with patient about posture and ergonomics.  Have will be traveling and we discussed different ergonomic changes on the plane.  Follow-up again in 6 to 8 weeks

## 2023-08-05 NOTE — Assessment & Plan Note (Signed)
 Seems like a strain.  Discussed icing regimen of home exercises, treatments to improve increase activity slowly.  Compression discussed, follow-up again in 6 to 8 weeks worsening pain consider ultrasound.  Differential includes lumbar radiculopathy but highly unlikely.

## 2023-08-13 ENCOUNTER — Other Ambulatory Visit: Payer: Self-pay | Admitting: Family Medicine

## 2023-08-15 DIAGNOSIS — M9905 Segmental and somatic dysfunction of pelvic region: Secondary | ICD-10-CM | POA: Diagnosis not present

## 2023-08-15 DIAGNOSIS — M5127 Other intervertebral disc displacement, lumbosacral region: Secondary | ICD-10-CM | POA: Diagnosis not present

## 2023-08-15 DIAGNOSIS — M9901 Segmental and somatic dysfunction of cervical region: Secondary | ICD-10-CM | POA: Diagnosis not present

## 2023-08-15 DIAGNOSIS — M9904 Segmental and somatic dysfunction of sacral region: Secondary | ICD-10-CM | POA: Diagnosis not present

## 2023-08-18 ENCOUNTER — Ambulatory Visit: Admitting: Primary Care

## 2023-08-25 DIAGNOSIS — M25572 Pain in left ankle and joints of left foot: Secondary | ICD-10-CM | POA: Diagnosis not present

## 2023-08-25 DIAGNOSIS — M25562 Pain in left knee: Secondary | ICD-10-CM | POA: Diagnosis not present

## 2023-08-26 ENCOUNTER — Ambulatory Visit (HOSPITAL_COMMUNITY): Admission: RE | Admit: 2023-08-26 | Source: Ambulatory Visit

## 2023-08-27 ENCOUNTER — Other Ambulatory Visit (HOSPITAL_COMMUNITY): Payer: Self-pay | Admitting: Medical

## 2023-08-27 DIAGNOSIS — M5127 Other intervertebral disc displacement, lumbosacral region: Secondary | ICD-10-CM | POA: Diagnosis not present

## 2023-08-27 DIAGNOSIS — M9905 Segmental and somatic dysfunction of pelvic region: Secondary | ICD-10-CM | POA: Diagnosis not present

## 2023-08-27 DIAGNOSIS — M9901 Segmental and somatic dysfunction of cervical region: Secondary | ICD-10-CM | POA: Diagnosis not present

## 2023-08-27 DIAGNOSIS — R609 Edema, unspecified: Secondary | ICD-10-CM

## 2023-08-27 DIAGNOSIS — M9904 Segmental and somatic dysfunction of sacral region: Secondary | ICD-10-CM | POA: Diagnosis not present

## 2023-08-28 ENCOUNTER — Ambulatory Visit (HOSPITAL_COMMUNITY)
Admission: RE | Admit: 2023-08-28 | Discharge: 2023-08-28 | Disposition: A | Discharge status: Home / Self Care | Source: Ambulatory Visit | Attending: Vascular Surgery | Admitting: Vascular Surgery

## 2023-08-28 DIAGNOSIS — R609 Edema, unspecified: Secondary | ICD-10-CM | POA: Insufficient documentation

## 2023-08-28 DIAGNOSIS — E039 Hypothyroidism, unspecified: Secondary | ICD-10-CM | POA: Diagnosis not present

## 2023-08-28 DIAGNOSIS — M791 Myalgia, unspecified site: Secondary | ICD-10-CM | POA: Diagnosis not present

## 2023-08-28 DIAGNOSIS — E559 Vitamin D deficiency, unspecified: Secondary | ICD-10-CM | POA: Diagnosis not present

## 2023-08-28 DIAGNOSIS — E063 Autoimmune thyroiditis: Secondary | ICD-10-CM | POA: Diagnosis not present

## 2023-09-01 DIAGNOSIS — M9901 Segmental and somatic dysfunction of cervical region: Secondary | ICD-10-CM | POA: Diagnosis not present

## 2023-09-01 DIAGNOSIS — M9904 Segmental and somatic dysfunction of sacral region: Secondary | ICD-10-CM | POA: Diagnosis not present

## 2023-09-01 DIAGNOSIS — M9905 Segmental and somatic dysfunction of pelvic region: Secondary | ICD-10-CM | POA: Diagnosis not present

## 2023-09-01 DIAGNOSIS — M5127 Other intervertebral disc displacement, lumbosacral region: Secondary | ICD-10-CM | POA: Diagnosis not present

## 2023-09-03 NOTE — Progress Notes (Unsigned)
 Darlyn Claud[facility] [address] [place] [address] Phone: [phone] Subjective:   LILLETTE [name] am a scribe for Dr. Claudene.   I'm seeing this patient by the request  of:  Kuneff, Renee A, DO  CC: Back and neck pain follow-up  YEP:Dlagzrupcz  Stefany Maria Gosnell is a 58 y.o. female coming in with complaint of back and neck pain. OMT 09/04/2023. Also f/u for R hip pain. Patient states back and neck not too bad. Left knee needs looking at today. Right hip is not bad today either. Two weeks ago tomorrow is when it started hurting. Wave thru her onto the sand and the leg went the wrong way. RICE protocol and went to [facility]. MCL strain more than likely from reports from other doctors. Took pain medicine for the first 48 hours. Currently wearing a brace.   Medications patient has been prescribed: None  Taking:      Recommend repeating TSH   Reviewed prior external information including notes and imaging from previsou exam, outside providers and external EMR if available.   As well as notes that were available from care everywhere and other healthcare systems.  Past medical history, social, surgical and family history all reviewed in electronic medical record.  No pertanent information unless stated regarding to the chief complaint.   Past Medical History:  Diagnosis Date   Arthritis    Broken bones    scaphoid and elbow   Closed nondisplaced fracture of scaphoid of left wrist 10/24/2022   Endometrial polyp    GAD (generalized anxiety disorder)    History of frequent urinary tract infections    Iron deficiency anemia    Osteopenia    Polyarthralgia    06-24-2022  per pt just tested positive RA this past friday 06-21-2022, test done by orthopedic   PONV (postoperative nausea and vomiting)    Seasonal allergies    Somatic dysfunction of back    Uterine fibroid    Wears glasses     Allergies  Allergen Reactions   Epinephrine      Lightheaded, things spin and start to go dark, rapid HR per patient.   Latex Rash     Review of Systems:  No headache, visual changes, nausea, vomiting, diarrhea, constipation, dizziness, abdominal pain, skin rash, fevers, chills, night sweats, weight loss, swollen lymph nodes, body aches, joint swelling, chest pain, shortness of breath, mood changes. POSITIVE muscle aches  Objective  Blood pressure 102/60, pulse 76, height 5' 7 (1.702 m), weight 172 lb 6.4 oz (78.2 kg), SpO2 99%.   General: No apparent distress alert and oriented x3 mood and affect normal, dressed appropriately.  HEENT: Pupils equal, extraocular movements intact  Respiratory: Patient's speak in full sentences and does not appear short of breath  Cardiovascular: No lower extremity edema, non tender, no erythema  Gait MSK:  Back does have some loss of lordosis.  Some tight is noted in the paraspinal musculature.  In the right parascapular area.  Neck still tightness in the occipital cervical area. Left knee just some mild tenderness noted over the medial joint line.  No instability with valgus or varus force.  Limited muscular skeletal ultrasound was performed and interpreted by CLAUDENE HUSSAR, M   Limited ultrasound shows very mild cortical irregularity at the tibial portion of the MCL with less than 10% of the ligaments of the deep fibers showing possible tearing.  Under dynamic testing no significant gapping appreciated. Impression: Questionable MCL sprain  versus very small tear  Osteopathic findings  C2 flexed rotated and side bent right C6 flexed rotated and side bent left T3 extended rotated and side bent right inhaled rib T9 extended rotated and side bent left L2 flexed rotated and side bent right Sacrum right on right       Assessment and Plan:  Rheumatoid factor positive Patient sedimentation rate is significantly better at this time.  No sign of any flare.  It is improving in strength and  conditioning.  No changes to medications at this time.  Follow-up with me again in 2 to 3 months  MCL sprain of left knee Ultrasound shows a very mild sprain noted.  No instability noted.  Likely will improve with conservative therapy.  Discussed still some mild icing regimen but nothing severe.  Follow-up again in 2 months    Nonallopathic problems  Decision today to treat with OMT was based on Physical Exam  After verbal consent patient was treated with HVLA, ME, FPR techniques in cervical, rib, thoracic, lumbar, and sacral  areas  Patient tolerated the procedure well with improvement in symptoms  Patient given exercises, stretches and lifestyle modifications  See medications in patient instructions if given  Patient will follow up in 4-8 weeks    The above documentation has been reviewed and is accurate and complete Jarick Harkins M Gerarda Conklin, DO          Note: This dictation was prepared with Dragon dictation along with smaller phrase technology. Any transcriptional errors that result from this process are unintentional.

## 2023-09-04 ENCOUNTER — Ambulatory Visit: Admitting: Family Medicine

## 2023-09-04 ENCOUNTER — Other Ambulatory Visit: Payer: Self-pay

## 2023-09-04 ENCOUNTER — Encounter: Payer: Self-pay | Admitting: Family Medicine

## 2023-09-04 VITALS — BP 102/60 | HR 76 | Ht 67.0 in | Wt 172.4 lb

## 2023-09-04 DIAGNOSIS — M9901 Segmental and somatic dysfunction of cervical region: Secondary | ICD-10-CM | POA: Diagnosis not present

## 2023-09-04 DIAGNOSIS — M25562 Pain in left knee: Secondary | ICD-10-CM | POA: Diagnosis not present

## 2023-09-04 DIAGNOSIS — M9903 Segmental and somatic dysfunction of lumbar region: Secondary | ICD-10-CM | POA: Diagnosis not present

## 2023-09-04 DIAGNOSIS — M9904 Segmental and somatic dysfunction of sacral region: Secondary | ICD-10-CM | POA: Diagnosis not present

## 2023-09-04 DIAGNOSIS — M9902 Segmental and somatic dysfunction of thoracic region: Secondary | ICD-10-CM

## 2023-09-04 DIAGNOSIS — M9908 Segmental and somatic dysfunction of rib cage: Secondary | ICD-10-CM

## 2023-09-04 DIAGNOSIS — R768 Other specified abnormal immunological findings in serum: Secondary | ICD-10-CM | POA: Diagnosis not present

## 2023-09-04 DIAGNOSIS — S83412A Sprain of medial collateral ligament of left knee, initial encounter: Secondary | ICD-10-CM | POA: Diagnosis not present

## 2023-09-04 NOTE — Assessment & Plan Note (Signed)
 Patient sedimentation rate is significantly better at this time.  No sign of any flare.  It is improving in strength and conditioning.  No changes to medications at this time.  Follow-up with me again in 2 to 3 months

## 2023-09-04 NOTE — Patient Instructions (Signed)
Good to see you See me again in  

## 2023-09-04 NOTE — Assessment & Plan Note (Signed)
 Ultrasound shows a very mild sprain noted.  No instability noted.  Likely will improve with conservative therapy.  Discussed still some mild icing regimen but nothing severe.  Follow-up again in 2 months

## 2023-09-09 DIAGNOSIS — Z124 Encounter for screening for malignant neoplasm of cervix: Secondary | ICD-10-CM | POA: Diagnosis not present

## 2023-09-09 DIAGNOSIS — Z1151 Encounter for screening for human papillomavirus (HPV): Secondary | ICD-10-CM | POA: Diagnosis not present

## 2023-09-09 DIAGNOSIS — R319 Hematuria, unspecified: Secondary | ICD-10-CM | POA: Diagnosis not present

## 2023-09-09 DIAGNOSIS — Z01419 Encounter for gynecological examination (general) (routine) without abnormal findings: Secondary | ICD-10-CM | POA: Diagnosis not present

## 2023-09-09 DIAGNOSIS — Z6827 Body mass index (BMI) 27.0-27.9, adult: Secondary | ICD-10-CM | POA: Diagnosis not present

## 2023-09-10 ENCOUNTER — Ambulatory Visit: Admitting: Internal Medicine

## 2023-09-10 DIAGNOSIS — E0590 Thyrotoxicosis, unspecified without thyrotoxic crisis or storm: Secondary | ICD-10-CM | POA: Diagnosis not present

## 2023-09-11 ENCOUNTER — Other Ambulatory Visit: Payer: Self-pay | Admitting: Family Medicine

## 2023-09-15 ENCOUNTER — Other Ambulatory Visit: Payer: Self-pay | Admitting: Family Medicine

## 2023-09-18 ENCOUNTER — Ambulatory Visit: Admitting: Family Medicine

## 2023-09-19 DIAGNOSIS — M9904 Segmental and somatic dysfunction of sacral region: Secondary | ICD-10-CM | POA: Diagnosis not present

## 2023-09-19 DIAGNOSIS — M9905 Segmental and somatic dysfunction of pelvic region: Secondary | ICD-10-CM | POA: Diagnosis not present

## 2023-09-19 DIAGNOSIS — M9901 Segmental and somatic dysfunction of cervical region: Secondary | ICD-10-CM | POA: Diagnosis not present

## 2023-09-19 DIAGNOSIS — M5127 Other intervertebral disc displacement, lumbosacral region: Secondary | ICD-10-CM | POA: Diagnosis not present

## 2023-10-01 ENCOUNTER — Encounter: Payer: Self-pay | Admitting: Family Medicine

## 2023-10-03 DIAGNOSIS — M9904 Segmental and somatic dysfunction of sacral region: Secondary | ICD-10-CM | POA: Diagnosis not present

## 2023-10-03 DIAGNOSIS — M9901 Segmental and somatic dysfunction of cervical region: Secondary | ICD-10-CM | POA: Diagnosis not present

## 2023-10-03 DIAGNOSIS — M9905 Segmental and somatic dysfunction of pelvic region: Secondary | ICD-10-CM | POA: Diagnosis not present

## 2023-10-03 DIAGNOSIS — M5127 Other intervertebral disc displacement, lumbosacral region: Secondary | ICD-10-CM | POA: Diagnosis not present

## 2023-10-24 ENCOUNTER — Ambulatory Visit: Admitting: Family Medicine

## 2023-10-24 DIAGNOSIS — M9905 Segmental and somatic dysfunction of pelvic region: Secondary | ICD-10-CM | POA: Diagnosis not present

## 2023-10-24 DIAGNOSIS — M9904 Segmental and somatic dysfunction of sacral region: Secondary | ICD-10-CM | POA: Diagnosis not present

## 2023-10-24 DIAGNOSIS — M9901 Segmental and somatic dysfunction of cervical region: Secondary | ICD-10-CM | POA: Diagnosis not present

## 2023-10-24 DIAGNOSIS — M5127 Other intervertebral disc displacement, lumbosacral region: Secondary | ICD-10-CM | POA: Diagnosis not present

## 2023-10-29 DIAGNOSIS — M5127 Other intervertebral disc displacement, lumbosacral region: Secondary | ICD-10-CM | POA: Diagnosis not present

## 2023-10-29 DIAGNOSIS — M9904 Segmental and somatic dysfunction of sacral region: Secondary | ICD-10-CM | POA: Diagnosis not present

## 2023-10-29 DIAGNOSIS — M9905 Segmental and somatic dysfunction of pelvic region: Secondary | ICD-10-CM | POA: Diagnosis not present

## 2023-10-29 DIAGNOSIS — M9901 Segmental and somatic dysfunction of cervical region: Secondary | ICD-10-CM | POA: Diagnosis not present

## 2023-11-07 DIAGNOSIS — M5127 Other intervertebral disc displacement, lumbosacral region: Secondary | ICD-10-CM | POA: Diagnosis not present

## 2023-11-07 DIAGNOSIS — M9901 Segmental and somatic dysfunction of cervical region: Secondary | ICD-10-CM | POA: Diagnosis not present

## 2023-11-07 DIAGNOSIS — M9904 Segmental and somatic dysfunction of sacral region: Secondary | ICD-10-CM | POA: Diagnosis not present

## 2023-11-07 DIAGNOSIS — M9905 Segmental and somatic dysfunction of pelvic region: Secondary | ICD-10-CM | POA: Diagnosis not present

## 2023-11-11 ENCOUNTER — Encounter: Payer: Self-pay | Admitting: Emergency Medicine

## 2023-11-13 ENCOUNTER — Other Ambulatory Visit: Payer: Self-pay | Admitting: Obstetrics and Gynecology

## 2023-11-13 DIAGNOSIS — Z1231 Encounter for screening mammogram for malignant neoplasm of breast: Secondary | ICD-10-CM

## 2023-11-13 NOTE — Progress Notes (Signed)
 [facility] [address] [place] [address] Phone: [phone] Subjective:   [name], am serving as Short scribe for Dr. Arthea Claudene.  I'm seeing this patient by the request  of:  Kuneff, Renee A, DO  CC: left knee pain   YEP:Dlagzrupcz  09/04/2023 Ultrasound shows Short very mild sprain noted.  No instability noted.  Likely will improve with conservative therapy.  Discussed still some mild icing regimen but nothing severe.  Follow-up again in 2 months     Patient sedimentation rate is significantly better at this time.  No sign of any flare.  It is improving in strength and conditioning.  No changes to medications at this time.  Follow-up with me again in 2 to 3 months     Update 11/18/2023 Karen Short [facility] is Short 58 y.o. female coming in with complaint of L knee pain. OMT last visit as well. Patient states L shoulder dent. L hamstring has not resolved. L tightness in low back SI area.      Past Medical History:  Diagnosis Date   Arthritis    Broken bones    scaphoid and elbow   Closed nondisplaced fracture of scaphoid of left wrist 10/24/2022   Endometrial polyp    GAD (generalized anxiety disorder)    History of frequent urinary tract infections    Iron deficiency anemia    Osteopenia    Polyarthralgia    06-24-2022  per pt just tested positive RA this past friday 06-21-2022, test done by orthopedic   PONV (postoperative nausea and vomiting)    Seasonal allergies    Somatic dysfunction of back    Uterine fibroid    Wears glasses    Past Surgical History:  Procedure Laterality Date   BREAST LUMPECTOMY Left 2008   benign;2018/2022/2023   COLONOSCOPY  2019   DILATATION & CURETTAGE/HYSTEROSCOPY WITH MYOSURE N/Short 06/28/2022   Procedure: DILATATION & CURETTAGE/HYSTEROSCOPY WITH MYOSURE;  Surgeon: Leva Rush, MD;  Location: [facility];  Service: Gynecology;  Laterality: N/Short;   HYSTEROSCOPY WITH D & C  09/2021    06/2022   KNEE ARTHROSCOPY W/ MENISCAL REPAIR Right 2014   LAPAROSCOPIC GELPORT ASSISTED MYOMECTOMY  2011   TONSILLECTOMY     child-1973   Social History   Socioeconomic History   Marital status: Legally Separated    Spouse name: Not on file   Number of children: Not on file   Years of education: Not on file   Highest education level: Master's degree (e.g., MA, MS, MEng, MEd, MSW, MBA)  Occupational History   Not on file  Tobacco Use   Smoking status: Former    Current packs/day: 0.00    Types: Cigarettes    Start date: 65    Quit date: 1987    Years since quitting: 38.7    Passive exposure: Past   Smokeless tobacco: Never  Vaping Use   Vaping status: Never Used  Substance and Sexual Activity   Alcohol use: Yes    Alcohol/week: 4.0 - 5.0 standard drinks of alcohol    Types: 4 - 5 Standard drinks or equivalent per week   Drug use: Never   Sexual activity: Not Currently    Partners: Female    Birth control/protection: None, Post-menopausal  Other Topics Concern   Not on file  Social History Narrative   Marital status/children/pets: Divorced, G0P0   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   Safety:         -  smoke alarm in the home:Yes     - wears seatbelt: Yes     - Feels safe in their relationships: Yes      Social Drivers of Corporate investment banker Strain: Low Risk  (08/07/2021)   Overall Financial Resource Strain (CARDIA)    Difficulty of Paying Living Expenses: Not hard at all  Food Insecurity: No Food Insecurity (08/07/2021)   Hunger Vital Sign    Worried About Running Out of Food in the Last Year: Never true    Ran Out of Food in the Last Year: Never true  Transportation Needs: No Transportation Needs (08/07/2021)   PRAPARE - Administrator, Civil Service (Medical): No    Lack of Transportation (Non-Medical): No  Physical Activity: Sufficiently Active (08/07/2021)   Exercise Vital Sign    Days of Exercise per Week: 5 days    Minutes of  Exercise per Session: 40 min  Stress: Stress Concern Present (08/07/2021)   [employer] of Occupational Health - Occupational Stress Questionnaire    Feeling of Stress : To some extent  Social Connections: Socially Isolated (08/07/2021)   Social Connection and Isolation Panel    Frequency of Communication with Friends and Family: Once Short week    Frequency of Social Gatherings with Friends and Family: Once Short week    Attends Religious Services: Never    Database administrator or Organizations: No    Attends Engineer, structural: Not on file    Marital Status: Separated   Allergies  Allergen Reactions   Epinephrine     Lightheaded, things spin and start to go dark, rapid HR per patient.   Latex Rash   Family History  Problem Relation Age of Onset   Kidney disease Mother    Miscarriages / [place] Mother    COPD Mother    Cancer Mother        skin   Arthritis Mother    Hyperlipidemia Mother    Heart disease Mother    Stroke Mother    Hypertension Mother    Diabetes Mother    Heart attack Mother    Heart attack Father    Hearing loss Father    Cancer Father        skin   Arthritis Father    Heart disease Father    Stroke Father    Hypertension Father    Asthma Brother    Hearing loss Brother    Arthritis Brother    Breast cancer Maternal Aunt    Breast cancer Paternal Aunt    Colon polyps Neg Hx    Colon cancer Neg Hx    Esophageal cancer Neg Hx    Rectal cancer Neg Hx    Stomach cancer Neg Hx     Current Outpatient Medications (Endocrine & Metabolic):    levothyroxine  (SYNTHROID ) 25 MCG tablet, TAKE 1 TABLET BY MOUTH EVERY DAY   LO LOESTRIN FE 1 MG-10 MCG / 10 MCG tablet, Take 1 tablet by mouth daily.      Current Outpatient Medications (Other):    Azelaic Acid (FINACEA) 15 % gel, Apply topically 2 (two) times daily. After skin is thoroughly washed and patted dry, gently but thoroughly massage Short thin film of azelaic acid cream into the  affected area twice daily, in the morning and evening.   DULoxetine  (CYMBALTA ) 20 MG capsule, TAKE 1 CAPSULE BY MOUTH EVERY DAY   Multiple Vitamin (DAILY VITAMIN) tablet, Take 1 tablet by  mouth daily. Take 1 tablet by mouth.   VITAMIN D -VITAMIN K PO, Take by mouth. 4000/100   Reviewed prior external information including notes and imaging from  primary care provider As well as notes that were available from care everywhere and other healthcare systems.  Past medical history, social, surgical and family history all reviewed in electronic medical record.  No pertanent information unless stated regarding to the chief complaint.   Review of Systems:  No headache, visual changes, nausea, vomiting, diarrhea, constipation, dizziness, abdominal pain, skin rash, fevers, chills, night sweats, weight loss, swollen lymph nodes, body aches, joint swelling, chest pain, shortness of breath, mood changes. POSITIVE muscle aches  Objective  Blood pressure 120/84, pulse 81, height 5' 7 (1.702 m), weight 171 lb (77.6 kg), SpO2 96%.   General: No apparent distress alert and oriented x3 mood and affect normal, dressed appropriately.  HEENT: Pupils equal, extraocular movements intact  Respiratory: Patient's speak in full sentences and does not appear short of breath  Cardiovascular: No lower extremity edema, non tender, no erythema  Knee exam shows patient does have mild arthritic changes noted. Tightness noted with straight leg test on the left side.  Tender to palpation for is fairly severely over both the piriformis and the sacroiliac joint on the left side.  Negative straight leg test though with radicular symptoms but does have tightness in the hamstring.  After verbal consent and with Short 21-gauge 2 inch needle patient was injected with 0.5 cc of 0.5% Marcaine and 1 cc of Kenalog 40 mg/mL into the left sacroiliac joint area. No blood loss.  Band-Aid placed.  Postinjection instruction given.    Impression  and Recommendations:     The above documentation has been reviewed and is accurate and complete Saloni Lablanc M Chamika Cunanan, DO

## 2023-11-14 DIAGNOSIS — M9901 Segmental and somatic dysfunction of cervical region: Secondary | ICD-10-CM | POA: Diagnosis not present

## 2023-11-14 DIAGNOSIS — M9904 Segmental and somatic dysfunction of sacral region: Secondary | ICD-10-CM | POA: Diagnosis not present

## 2023-11-14 DIAGNOSIS — M9905 Segmental and somatic dysfunction of pelvic region: Secondary | ICD-10-CM | POA: Diagnosis not present

## 2023-11-14 DIAGNOSIS — M5127 Other intervertebral disc displacement, lumbosacral region: Secondary | ICD-10-CM | POA: Diagnosis not present

## 2023-11-18 ENCOUNTER — Ambulatory Visit (INDEPENDENT_AMBULATORY_CARE_PROVIDER_SITE_OTHER): Admitting: Family Medicine

## 2023-11-18 ENCOUNTER — Encounter: Payer: Self-pay | Admitting: Family Medicine

## 2023-11-18 VITALS — BP 120/84 | HR 81 | Ht 67.0 in | Wt 171.0 lb

## 2023-11-18 DIAGNOSIS — M461 Sacroiliitis, not elsewhere classified: Secondary | ICD-10-CM

## 2023-11-18 NOTE — Patient Instructions (Addendum)
   SI joint injection Good to see you! See you again at next appointment

## 2023-11-18 NOTE — Assessment & Plan Note (Signed)
 Patient given injection and tolerated procedure well, discussed icing regimen and home exercises, discussed that the differential includes Morton's piriformis syndrome and may need to consider injection and follow-up.  Due to provider injury unable to do any manipulation but could be helpful for this individual.  Follow-up again in 6 to 8 weeks otherwise

## 2023-11-20 ENCOUNTER — Inpatient Hospital Stay
Admission: RE | Admit: 2023-11-20 | Discharge: 2023-11-20 | Attending: Obstetrics and Gynecology | Admitting: Obstetrics and Gynecology

## 2023-11-20 DIAGNOSIS — Z1231 Encounter for screening mammogram for malignant neoplasm of breast: Secondary | ICD-10-CM | POA: Diagnosis not present

## 2023-11-21 ENCOUNTER — Other Ambulatory Visit: Payer: Self-pay | Admitting: Internal Medicine

## 2023-11-21 DIAGNOSIS — R002 Palpitations: Secondary | ICD-10-CM | POA: Diagnosis not present

## 2023-11-22 LAB — NMR, LIPOPROFILE
Cholesterol, Total: 156 mg/dL (ref 100–199)
HDL Particle Number: 31 umol/L (ref >=30.5)
HDL-C: 67 mg/dL (ref >39)
LDL Particle Number: 736 nmol/L (ref <1000)
LDL Size: 21.2 nm (ref >20.5)
LDL-C (NIH Calc): 77 mg/dL (ref 0–99)
LP-IR Score: <25 (ref <=45)
Small LDL Particle Number: 209 nmol/L (ref <=527)
Triglycerides: 60 mg/dL (ref 0–149)

## 2023-11-22 LAB — LIPID PANEL
Chol/HDL Ratio: 2.5 ratio (ref 0.0–4.4)
Cholesterol, Total: 162 mg/dL (ref 100–199)
HDL: 66 mg/dL (ref >39)
LDL Chol Calc (NIH): 85 mg/dL (ref 0–99)
Triglycerides: 56 mg/dL (ref 0–149)
VLDL Cholesterol Cal: 11 mg/dL (ref 5–40)

## 2023-11-24 ENCOUNTER — Encounter: Payer: Self-pay | Admitting: Internal Medicine

## 2023-11-24 ENCOUNTER — Ambulatory Visit: Attending: Internal Medicine | Admitting: Internal Medicine

## 2023-11-24 ENCOUNTER — Ambulatory Visit: Payer: Self-pay | Admitting: Internal Medicine

## 2023-11-24 VITALS — BP 124/74 | HR 61 | Resp 16 | Ht 67.0 in

## 2023-11-24 DIAGNOSIS — E785 Hyperlipidemia, unspecified: Secondary | ICD-10-CM | POA: Diagnosis not present

## 2023-11-24 DIAGNOSIS — I2510 Atherosclerotic heart disease of native coronary artery without angina pectoris: Secondary | ICD-10-CM | POA: Diagnosis not present

## 2023-11-24 NOTE — Patient Instructions (Signed)
 Medication Instructions:  NO CHANGES  *If you need a refill on your cardiac medications before your next appointment, please call your pharmacy*  Lab Work: FASTING NMR lipoprofile in 1 year   If you have labs (blood work) drawn today and your tests are completely normal, you will receive your results only by: MyChart Message (if you have MyChart) OR A paper copy in the mail If you have any lab test that is abnormal or we need to change your treatment, we will call you to review the results.   Follow-Up: At [facility], you and your health needs are our priority.  As part of our continuing mission to provide you with exceptional heart care, our providers are all part of one team.  This team includes your primary Cardiologist (physician) and Advanced Practice Providers or APPs (Physician Assistants and Nurse Practitioners) who all work together to provide you with the care you need, when you need it.  Your next appointment:    AS NEEDED with Dr. Mona  We recommend signing up for the patient portal called MyChart.  Sign up information is provided on this After Visit Summary.  MyChart is used to connect with patients for Virtual Visits (Telemedicine).  Patients are able to view lab/test results, encounter notes, upcoming appointments, etc.  Non-urgent messages can be sent to your provider as well.   To learn more about what you can do with MyChart, go to [email or web address].

## 2023-11-24 NOTE — Progress Notes (Signed)
 OFFICE CONSULT NOTE  Chief Complaint:  Palpitations, coronary artery calcium  Primary Care Physician: Onita Rush, Short  HPI:  Karen Short is Short 58 y.o. female who is being seen today for the evaluation of palpitations and CAC at the request of Kuneff, Renee A, DO. This is Short 58 year old female kindly referred for evaluation of an abnormal calcium score.  She has Short history of smoking remotely and underwent screening coronary calcium score recently which showed Short calcium score of 18.8, 84th percentile for age and sex matched controls.  Subsequently she had Short chest CT which showed some pulmonary nodules and has been referred to pulmonary.  She reports Short family history of MI in her father when he was 66.  She is not on any statin therapy.  She has had pretty good lipids with total cholesterol 168, triglycerides 78, HDL 66 and LDL 86.  She reports being very active and eats Short low-fat diet.  11/24/2023  [name] returns today for follow-up.  She has had an excellent improvement in her lipids.  Her LDL particle number is now normal at 736, LDL 77, HDL 67, triglycerides 60 and small LDL particle number of 209.  This has been with just diet alone.  She has been very hesitant to try statin therapy.  This is also in the setting of recent issues with her thyroid  including being hypothyroid and more recently hyperthyroid now on methimazole.  She also was found to have Short positive rheumatoid factor but is ultimately thought by rheumatology to have possible lupus.  PMHx:  Past Medical History:  Diagnosis Date   Arthritis    Broken bones    scaphoid and elbow   Closed nondisplaced fracture of scaphoid of left wrist 10/24/2022   Endometrial polyp    GAD (generalized anxiety disorder)    History of frequent urinary tract infections    Iron deficiency anemia    Osteopenia    Polyarthralgia    06-24-2022  per pt just tested positive RA this past friday 06-21-2022, test done by orthopedic    PONV (postoperative nausea and vomiting)    Seasonal allergies    Somatic dysfunction of back    Uterine fibroid    Wears glasses     Past Surgical History:  Procedure Laterality Date   BREAST LUMPECTOMY Left 2008   benign;2018/2022/2023   COLONOSCOPY  2019   DILATATION & CURETTAGE/HYSTEROSCOPY WITH MYOSURE N/Short 06/28/2022   Procedure: DILATATION & CURETTAGE/HYSTEROSCOPY WITH MYOSURE;  Surgeon: Karen Short;  Location: [facility];  Service: Gynecology;  Laterality: N/Short;   HYSTEROSCOPY WITH D & C  09/2021   06/2022   KNEE ARTHROSCOPY W/ MENISCAL REPAIR Right 2014   LAPAROSCOPIC GELPORT ASSISTED MYOMECTOMY  2011   TONSILLECTOMY     child-1973    FAMHx:  Family History  Problem Relation Age of Onset   Kidney disease Mother    Miscarriages / [place] Mother    COPD Mother    Cancer Mother        skin   Arthritis Mother    Hyperlipidemia Mother    Heart disease Mother    Stroke Mother    Hypertension Mother    Diabetes Mother    Heart attack Mother    Heart attack Father    Hearing loss Father    Cancer Father        skin   Arthritis Father    Heart disease Father    Stroke  Father    Hypertension Father    Breast cancer Maternal Aunt    Breast cancer Paternal Aunt    Breast cancer Paternal Aunt    Asthma Brother    Hearing loss Brother    Arthritis Brother    Colon polyps Neg Hx    Colon cancer Neg Hx    Esophageal cancer Neg Hx    Rectal cancer Neg Hx    Stomach cancer Neg Hx     SOCHx:   reports that she quit smoking about 38 years ago. Her smoking use included cigarettes. She started smoking about 48 years ago. She has been exposed to tobacco smoke. She has never used smokeless tobacco. She reports current alcohol use of about 4.0 - 5.0 standard drinks of alcohol per week. She reports that she does not use drugs.  ALLERGIES:  Allergies  Allergen Reactions   Epinephrine     Lightheaded, things spin and start to go dark, rapid HR  per patient.   Latex Rash    ROS: Pertinent items noted in HPI and remainder of comprehensive ROS otherwise negative.  HOME MEDS: Current Outpatient Medications on File Prior to Visit  Medication Sig Dispense Refill   Azelaic Acid (FINACEA) 15 % gel Apply topically 2 (two) times daily. After skin is thoroughly washed and patted dry, gently but thoroughly massage Short thin film of azelaic acid cream into the affected area twice daily, in the morning and evening.     DULoxetine  (CYMBALTA ) 20 MG capsule TAKE 1 CAPSULE BY MOUTH EVERY DAY 30 capsule 0   estradiol (VIVELLE-DOT) 0.075 MG/24HR Place 1 patch onto the skin.     methimazole (TAPAZOLE) 5 MG tablet Take 5 mg by mouth daily.     Multiple Vitamin (DAILY VITAMIN) tablet Take 1 tablet by mouth daily. Take 1 tablet by mouth.     progesterone (PROMETRIUM) 100 MG capsule Take 100 mg by mouth daily.     VITAMIN D -VITAMIN K PO Take by mouth. 4000/100     No current facility-administered medications on file prior to visit.    LABS/IMAGING: No results found for this or any previous visit (from the past 48 hours). No results found.  LIPID PANEL:    Component Value Date/Time   CHOL 162 11/21/2023 0820   TRIG 56 11/21/2023 0820   HDL 66 11/21/2023 0820   CHOLHDL 2.5 11/21/2023 0820   CHOLHDL 3 01/27/2023 0902   VLDL 15.6 01/27/2023 0902   LDLCALC 85 11/21/2023 0820    WEIGHTS: Wt Readings from Last 3 Encounters:  11/18/23 171 lb (77.6 kg)  09/04/23 172 lb 6.4 oz (78.2 kg)  08/05/23 173 lb (78.5 kg)    VITALS: BP 124/74 (BP Location: Right Arm, Patient Position: Sitting)   Pulse 61   Resp 16   Ht 5' 7 (1.702 m)   SpO2 99%   BMI 26.78 kg/m   EXAM: Deferred  EKG: Deferred  ASSESSMENT: Mixed dyslipidemia, goal LDL less than 70 CAC score of 18.8, 84th percentile Family history of premature coronary disease in her father Abnormal thyroid  Positive rheumatoid factor  PLAN: 1.  [name] has made substantial dietary  and lifestyle changes and improved her cholesterol.  She has also been dealing with hypo and hyperthyroidism recently and likely had Short positive rheumatoid factor although may have lupus per rheumatology.  At this moment she wants to continue with lifestyle therapy and I would agree because of her improvements and the fluctuation in her lipids it would  make sense to reassess her labs again in about Short year to see if they have remains clinically stable.  Vinie KYM Maxcy, Short, [facility], FNLA, FACP  [place]  [facility]  Medical Director of the Advanced Lipid Disorders &  Cardiovascular Risk Reduction Clinic Diplomate of the American Board of Clinical Lipidology Attending Cardiologist  Direct Dial: [phone]  Fax: [phone]  Website:  [email or web address] Vinie BROCKS Renai Lopata 11/24/2023, 11:57 AM

## 2023-11-26 ENCOUNTER — Ambulatory Visit
Admission: RE | Admit: 2023-11-26 | Discharge: 2023-11-26 | Disposition: A | Source: Ambulatory Visit | Attending: Emergency Medicine | Admitting: Emergency Medicine

## 2023-11-26 DIAGNOSIS — J984 Other disorders of lung: Secondary | ICD-10-CM | POA: Diagnosis not present

## 2023-11-26 DIAGNOSIS — R911 Solitary pulmonary nodule: Secondary | ICD-10-CM

## 2023-11-27 ENCOUNTER — Ambulatory Visit: Admitting: Family Medicine

## 2023-11-27 DIAGNOSIS — E0590 Thyrotoxicosis, unspecified without thyrotoxic crisis or storm: Secondary | ICD-10-CM | POA: Diagnosis not present

## 2023-12-10 NOTE — Progress Notes (Unsigned)
 [facility] [address] [place] [address] Phone: [phone] Subjective:   Karen Short, am serving as a scribe for Dr. Arthea Claudene.  I'm seeing this patient by the request  of:  Onita Rush, MD  CC: Left lower back pain follow-up  YEP:Dlagzrupcz  11/18/2023 Patient given injection and tolerated procedure well, discussed icing regimen and home exercises, discussed that the differential includes Morton's piriformis syndrome and may need to consider injection and follow-up.  Due to provider injury unable to do any manipulation but could be helpful for this individual.  Follow-up again in 6 to 8 weeks otherwise      Update 12/11/2023 Karen Short is a 58 y.o. female coming in with complaint of L SI jt pain.  Given injection in the sacroiliac joint last time.  Patient states that SI joint pain has subsided. Still has tight L hamstring.   Two weeks ago her R hip locked when sitting criss cross. Felt like she had to pop hip to alleviate her pain. No pain but does not feel as strong in R hip. Unable to flex R hip as far as L. Unable to ride bike in standing position and leg gets fatigued when she goes up stairs.        Past Medical History:  Diagnosis Date   Arthritis    Broken bones    scaphoid and elbow   Closed nondisplaced fracture of scaphoid of left wrist 10/24/2022   Endometrial polyp    GAD (generalized anxiety disorder)    History of frequent urinary tract infections    Iron deficiency anemia    Osteopenia    Polyarthralgia    06-24-2022  per pt just tested positive RA this past friday 06-21-2022, test done by orthopedic   PONV (postoperative nausea and vomiting)    Seasonal allergies    Somatic dysfunction of back    Uterine fibroid    Wears glasses    Past Surgical History:  Procedure Laterality Date   BREAST LUMPECTOMY Left 2008   benign;2018/2022/2023   COLONOSCOPY  2019   DILATATION & CURETTAGE/HYSTEROSCOPY  WITH MYOSURE N/A 06/28/2022   Procedure: DILATATION & CURETTAGE/HYSTEROSCOPY WITH MYOSURE;  Surgeon: Leva Rush, MD;  Location: [facility];  Service: Gynecology;  Laterality: N/A;   HYSTEROSCOPY WITH D & C  09/2021   06/2022   KNEE ARTHROSCOPY W/ MENISCAL REPAIR Right 2014   LAPAROSCOPIC GELPORT ASSISTED MYOMECTOMY  2011   TONSILLECTOMY     child-1973   Social History   Socioeconomic History   Marital status: Legally Separated    Spouse name: Not on file   Number of children: Not on file   Years of education: Not on file   Highest education level: Master's degree (e.g., MA, MS, MEng, MEd, MSW, MBA)  Occupational History   Not on file  Tobacco Use   Smoking status: Former    Current packs/day: 0.00    Types: Cigarettes    Start date: 56    Quit date: 1987    Years since quitting: 38.8    Passive exposure: Past   Smokeless tobacco: Never  Vaping Use   Vaping status: Never Used  Substance and Sexual Activity   Alcohol use: Yes    Alcohol/week: 4.0 - 5.0 standard drinks of alcohol    Types: 4 - 5 Standard drinks or equivalent per week   Drug use: Never   Sexual activity: Not Currently    Partners:  Female    Birth control/protection: None, Post-menopausal  Other Topics Concern   Not on file  Social History Narrative   Marital status/children/pets: Divorced, G0P0   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   Safety:         -smoke alarm in the home:Yes     - wears seatbelt: Yes     - Feels safe in their relationships: Yes      Social Drivers of Corporate investment banker Strain: Low Risk  (08/07/2021)   Overall Financial Resource Strain (CARDIA)    Difficulty of Paying Living Expenses: Not hard at all  Food Insecurity: No Food Insecurity (08/07/2021)   Hunger Vital Sign    Worried About Running Out of Food in the Last Year: Never true    Ran Out of Food in the Last Year: Never true  Transportation Needs: No Transportation Needs (08/07/2021)    PRAPARE - Administrator, Civil Service (Medical): No    Lack of Transportation (Non-Medical): No  Physical Activity: Sufficiently Active (08/07/2021)   Exercise Vital Sign    Days of Exercise per Week: 5 days    Minutes of Exercise per Session: 40 min  Stress: Stress Concern Present (08/07/2021)   [employer] of Occupational Health - Occupational Stress Questionnaire    Feeling of Stress : To some extent  Social Connections: Socially Isolated (08/07/2021)   Social Connection and Isolation Panel    Frequency of Communication with Friends and Family: Once a week    Frequency of Social Gatherings with Friends and Family: Once a week    Attends Religious Services: Never    Database administrator or Organizations: No    Attends Engineer, structural: Not on file    Marital Status: Separated   Allergies  Allergen Reactions   Epinephrine     Lightheaded, things spin and start to go dark, rapid HR per patient.   Latex Rash   Family History  Problem Relation Age of Onset   Kidney disease Mother    Miscarriages / [place] Mother    COPD Mother    Cancer Mother        skin   Arthritis Mother    Hyperlipidemia Mother    Heart disease Mother    Stroke Mother    Hypertension Mother    Diabetes Mother    Heart attack Mother    Heart attack Father    Hearing loss Father    Cancer Father        skin   Arthritis Father    Heart disease Father    Stroke Father    Hypertension Father    Breast cancer Maternal Aunt    Breast cancer Paternal Aunt    Breast cancer Paternal Aunt    Asthma Brother    Hearing loss Brother    Arthritis Brother    Colon polyps Neg Hx    Colon cancer Neg Hx    Esophageal cancer Neg Hx    Rectal cancer Neg Hx    Stomach cancer Neg Hx     Current Outpatient Medications (Endocrine & Metabolic):    estradiol (VIVELLE-DOT) 0.075 MG/24HR, Place 1 patch onto the skin.   methimazole (TAPAZOLE) 5 MG tablet, Take 5 mg by mouth  daily.   progesterone (PROMETRIUM) 100 MG capsule, Take 100 mg by mouth daily.      Current Outpatient Medications (Other):    Azelaic Acid (FINACEA) 15 % gel, Apply topically 2 (two) times  daily. After skin is thoroughly washed and patted dry, gently but thoroughly massage a thin film of azelaic acid cream into the affected area twice daily, in the morning and evening.   DULoxetine  (CYMBALTA ) 20 MG capsule, TAKE 1 CAPSULE BY MOUTH EVERY DAY   Multiple Vitamin (DAILY VITAMIN) tablet, Take 1 tablet by mouth daily. Take 1 tablet by mouth.   VITAMIN D -VITAMIN K PO, Take by mouth. 4000/100   Reviewed prior external information including notes and imaging from  primary care provider As well as notes that were available from care everywhere and other healthcare systems.  Past medical history, social, surgical and family history all reviewed in electronic medical record.  No pertanent information unless stated regarding to the chief complaint.   Review of Systems:  No headache, visual changes, nausea, vomiting, diarrhea, constipation, dizziness, abdominal pain, skin rash, fevers, chills, night sweats, weight loss, swollen lymph nodes, body aches, joint swelling, chest pain, shortness of breath, mood changes. POSITIVE muscle aches  Objective  Blood pressure 110/78, pulse (!) 59, height 5' 7 (1.702 m), weight 170 lb (77.1 kg), SpO2 99%.   General: No apparent distress alert and oriented x3 mood and affect normal, dressed appropriately.  HEENT: Pupils equal, extraocular movements intact  Respiratory: Patient's speak in full sentences and does not appear short of breath  Cardiovascular: No lower extremity edema, non tender, no erythema  Low back exam shows significant improvement over the sacroiliac joint.  Unfortunately antalgic gait with patient favoring the right hip.  Has difficulty lifting the right hip secondary to pain and movement even noted.  Negative straight leg test but pain with  internal and external range of motion.  Osteopathic findings C2 flexed rotated and side bent right C4 flexed rotated and side bent left C6 flexed rotated and side bent right T3 extended rotated and side bent right inhaled third rib T9 extended rotated and side bent left L2 flexed rotated and side bent right L5 flexed rotated and side bent right Sacrum right on right     Impression and Recommendations:    Right hip pain Out of proportion to the amount of pain at this moment.  He does have an has had a labral pathology previously on this discussed with patient that based on the regimen but I do feel with her limited range of motion and MR arthrogram to further evaluate labral pathology first possible AVN with patient having difficulty with her thyroid  is beneficial.  Will get laboratory workup to make sure nothing else is potentially contributing or some increase in sedimentation rate.  Follow-up with me again 6 to 8 weeks otherwise.     Decision today to treat with OMT was based on Physical Exam  After verbal consent patient was treated with HVLA, ME, FPR techniques in cervical, thoracic, rib, lumbar and sacral areas, all areas are chronic   Patient tolerated the procedure well with improvement in symptoms  Patient given exercises, stretches and lifestyle modifications  See medications in patient instructions if given  Patient will follow up in 4-8 weeks  The above documentation has been reviewed and is accurate and complete Karen Collignon M Kallyn Demarcus, DO

## 2023-12-11 ENCOUNTER — Ambulatory Visit: Admitting: Family Medicine

## 2023-12-11 ENCOUNTER — Encounter: Payer: Self-pay | Admitting: Family Medicine

## 2023-12-11 ENCOUNTER — Ambulatory Visit: Payer: Self-pay | Admitting: Family Medicine

## 2023-12-11 ENCOUNTER — Ambulatory Visit

## 2023-12-11 VITALS — BP 110/78 | HR 59 | Ht 67.0 in | Wt 170.0 lb

## 2023-12-11 DIAGNOSIS — M25551 Pain in right hip: Secondary | ICD-10-CM

## 2023-12-11 DIAGNOSIS — R5383 Other fatigue: Secondary | ICD-10-CM | POA: Diagnosis not present

## 2023-12-11 DIAGNOSIS — M9901 Segmental and somatic dysfunction of cervical region: Secondary | ICD-10-CM

## 2023-12-11 DIAGNOSIS — M9904 Segmental and somatic dysfunction of sacral region: Secondary | ICD-10-CM

## 2023-12-11 DIAGNOSIS — M9908 Segmental and somatic dysfunction of rib cage: Secondary | ICD-10-CM

## 2023-12-11 DIAGNOSIS — M9903 Segmental and somatic dysfunction of lumbar region: Secondary | ICD-10-CM | POA: Diagnosis not present

## 2023-12-11 DIAGNOSIS — M9902 Segmental and somatic dysfunction of thoracic region: Secondary | ICD-10-CM

## 2023-12-11 LAB — IBC PANEL
Iron: 85 ug/dL (ref 42–145)
Saturation Ratios: 24.7 % (ref 20.0–50.0)
TIBC: 344.4 ug/dL (ref 250.0–450.0)
Transferrin: 246 mg/dL (ref 212.0–360.0)

## 2023-12-11 LAB — C-REACTIVE PROTEIN: CRP: <0.5 mg/dL (ref 0.5–20.0)

## 2023-12-11 LAB — CBC WITH DIFFERENTIAL/PLATELET
Basophils Absolute: 0 K/uL (ref 0.0–0.1)
Basophils Relative: 0.5 % (ref 0.0–3.0)
Eosinophils Absolute: 0.1 K/uL (ref 0.0–0.7)
Eosinophils Relative: 2.5 % (ref 0.0–5.0)
HCT: 38.4 % (ref 36.0–46.0)
Hemoglobin: 12.8 g/dL (ref 12.0–15.0)
Lymphocytes Relative: 24.9 % (ref 12.0–46.0)
Lymphs Abs: 0.9 K/uL (ref 0.7–4.0)
MCHC: 33.3 g/dL (ref 30.0–36.0)
MCV: 90.3 fl (ref 78.0–100.0)
Monocytes Absolute: 0.4 K/uL (ref 0.1–1.0)
Monocytes Relative: 11.3 % (ref 3.0–12.0)
Neutro Abs: 2.3 K/uL (ref 1.4–7.7)
Neutrophils Relative %: 60.8 % (ref 43.0–77.0)
Platelets: 182 K/uL (ref 150.0–400.0)
RBC: 4.25 Mil/uL (ref 3.87–5.11)
RDW: 13.1 % (ref 11.5–15.5)
WBC: 3.8 K/uL — ABNORMAL LOW (ref 4.0–10.5)

## 2023-12-11 LAB — FERRITIN: Ferritin: 72.5 ng/mL (ref 10.0–291.0)

## 2023-12-11 LAB — COMPREHENSIVE METABOLIC PANEL WITH GFR
ALT: 37 U/L — ABNORMAL HIGH (ref 0–35)
AST: 30 U/L (ref 0–37)
Albumin: 4 g/dL (ref 3.5–5.2)
Alkaline Phosphatase: 87 U/L (ref 39–117)
BUN: 13 mg/dL (ref 6–23)
CO2: 30 meq/L (ref 19–32)
Calcium: 9.4 mg/dL (ref 8.4–10.5)
Chloride: 103 meq/L (ref 96–112)
Creatinine, Ser: 0.66 mg/dL (ref 0.40–1.20)
GFR: 97.29 mL/min (ref >60.00)
Glucose, Bld: 80 mg/dL (ref 70–99)
Potassium: 4.2 meq/L (ref 3.5–5.1)
Sodium: 138 meq/L (ref 135–145)
Total Bilirubin: 0.4 mg/dL (ref 0.2–1.2)
Total Protein: 7.5 g/dL (ref 6.0–8.3)

## 2023-12-11 LAB — VITAMIN B12: Vitamin B-12: 963 pg/mL — ABNORMAL HIGH (ref 211–911)

## 2023-12-11 LAB — VITAMIN D 25 HYDROXY (VIT D DEFICIENCY, FRACTURES): VITD: 73.84 ng/mL (ref 30.00–100.00)

## 2023-12-11 LAB — SEDIMENTATION RATE: Sed Rate: 39 mm/h — ABNORMAL HIGH (ref 0–30)

## 2023-12-11 LAB — TSH: TSH: <0.01 u[IU]/mL — ABNORMAL LOW (ref 0.35–5.50)

## 2023-12-11 LAB — URIC ACID: Uric Acid, Serum: 3.9 mg/dL (ref 2.4–7.0)

## 2023-12-11 NOTE — Assessment & Plan Note (Signed)
 Out of proportion to the amount of pain at this moment.  He does have an has had a labral pathology previously on this discussed with patient that based on the regimen but I do feel with her limited range of motion and MR arthrogram to further evaluate labral pathology first possible AVN with patient having difficulty with her thyroid  is beneficial.  Will get laboratory workup to make sure nothing else is potentially contributing or some increase in sedimentation rate.  Follow-up with me again 6 to 8 weeks otherwise.

## 2023-12-11 NOTE — Patient Instructions (Addendum)
 Labs today MRA R hip [id number] Xray today See me in 2 months

## 2023-12-14 LAB — ANTI-NUCLEAR AB-TITER (ANA TITER): ANA Titer 1: 1:40 {titer} — ABNORMAL HIGH

## 2023-12-14 LAB — CALCIUM, IONIZED: Calcium, Ion: 5.3 mg/dL (ref 4.7–5.5)

## 2023-12-14 LAB — PTH, INTACT AND CALCIUM
Calcium: 9.5 mg/dL (ref 8.6–10.4)
PTH: 19 pg/mL (ref 16–77)

## 2023-12-14 LAB — ANGIOTENSIN CONVERTING ENZYME: Angiotensin-Converting Enzyme: 48 U/L (ref 9–67)

## 2023-12-14 LAB — CYCLIC CITRUL PEPTIDE ANTIBODY, IGG: Cyclic Citrullin Peptide Ab: <16 U

## 2023-12-14 LAB — RHEUMATOID FACTOR: Rheumatoid fact SerPl-aCnc: 10 [IU]/mL (ref <14)

## 2023-12-14 LAB — ANA: Anti Nuclear Antibody (ANA): POSITIVE — AB

## 2023-12-15 DIAGNOSIS — E063 Autoimmune thyroiditis: Secondary | ICD-10-CM | POA: Diagnosis not present

## 2023-12-16 DIAGNOSIS — E0500 Thyrotoxicosis with diffuse goiter without thyrotoxic crisis or storm: Secondary | ICD-10-CM | POA: Diagnosis not present

## 2023-12-18 LAB — ANTI-TPO AB (RDL): Anti-TPO Ab (RDL): <9 [IU]/mL (ref <9.0)

## 2023-12-19 ENCOUNTER — Ambulatory Visit: Admitting: Family Medicine

## 2023-12-24 ENCOUNTER — Ambulatory Visit: Payer: Self-pay | Admitting: Emergency Medicine

## 2023-12-25 NOTE — Progress Notes (Signed)
 ATCx1 LVMTCB. Sending MyChart message as results are stable. NFN

## 2023-12-26 ENCOUNTER — Ambulatory Visit
Admission: RE | Admit: 2023-12-26 | Discharge: 2023-12-26 | Disposition: A | Discharge status: Home / Self Care | Source: Ambulatory Visit | Attending: Family Medicine | Admitting: Family Medicine

## 2023-12-26 DIAGNOSIS — M25551 Pain in right hip: Secondary | ICD-10-CM

## 2023-12-26 MED ORDER — IOPAMIDOL (ISOVUE-M 200) INJECTION 41%
10.0000 mL | Freq: Once | INTRAMUSCULAR | Status: AC
  Administered 2023-12-26: 10 mL via INTRA_ARTICULAR

## 2024-01-07 DIAGNOSIS — N939 Abnormal uterine and vaginal bleeding, unspecified: Secondary | ICD-10-CM | POA: Diagnosis not present

## 2024-01-09 DIAGNOSIS — F411 Generalized anxiety disorder: Secondary | ICD-10-CM | POA: Diagnosis not present

## 2024-01-12 DIAGNOSIS — H02834 Dermatochalasis of left upper eyelid: Secondary | ICD-10-CM | POA: Diagnosis not present

## 2024-01-12 DIAGNOSIS — H02831 Dermatochalasis of right upper eyelid: Secondary | ICD-10-CM | POA: Diagnosis not present

## 2024-01-14 DIAGNOSIS — R7989 Other specified abnormal findings of blood chemistry: Secondary | ICD-10-CM | POA: Diagnosis not present

## 2024-01-14 LAB — LAB REPORT - SCANNED
EGFR (Non-African Amer.): 86.2
Free T4: 1.3 ng/dL
TSH: 0.03

## 2024-01-29 NOTE — Progress Notes (Signed)
 [facility] [address] [place] [address] Phone: [phone] Subjective:   Karen Short, am serving as a scribe for Dr. Arthea Claudene.  I'm seeing this patient by the request  of:  Onita Rush, MD  CC: Back and neck pain follow-up  YEP:Dlagzrupcz  Karen Short is a 58 y.o. female coming in with complaint of back and neck pain. OMT 12/11/2023. Patient states that her strained psoas has drastically improved. Continues to have L hamstring tightness.   Medications patient has been prescribed: None  Taking:         Reviewed prior external information including notes and imaging from previsou exam, outside providers and external EMR if available.   As well as notes that were available from care everywhere and other healthcare systems.  Past medical history, social, surgical and family history all reviewed in electronic medical record.  No pertanent information unless stated regarding to the chief complaint.   Past Medical History:  Diagnosis Date   Arthritis    Broken bones    scaphoid and elbow   Closed nondisplaced fracture of scaphoid of left wrist 10/24/2022   Endometrial polyp    GAD (generalized anxiety disorder)    History of frequent urinary tract infections    Iron deficiency anemia    Osteopenia    Polyarthralgia    06-24-2022  per pt just tested positive RA this past friday 06-21-2022, test done by orthopedic   PONV (postoperative nausea and vomiting)    Seasonal allergies    Somatic dysfunction of back    Uterine fibroid    Wears glasses     Allergies[1]   Review of Systems:  No headache, visual changes, nausea, vomiting, diarrhea, constipation, dizziness, abdominal pain, skin rash, fevers, chills, night sweats, weight loss, swollen lymph nodes, body aches, joint swelling, chest pain, shortness of breath, mood changes. POSITIVE muscle aches  Objective  Blood pressure 124/86, pulse (!) 48, height 5' 7  (1.702 m), weight 172 lb (78 kg), SpO2 99%.   General: No apparent distress alert and oriented x3 mood and affect normal, dressed appropriately.  HEENT: Pupils equal, extraocular movements intact  Respiratory: Patient's speak in full sentences and does not appear short of breath  Cardiovascular: No lower extremity edema, non tender, no erythema  Gait MSK:  Back tightness noted in the paraspinal musculature.  Tightness noted on the left side.  Tightness noted in FABER left greater than right.  Osteopathic findings  C2 flexed rotated and side bent right C6 flexed rotated and side bent left T3 extended rotated and side bent right inhaled rib T9 extended rotated and side bent left L2 flexed rotated and side bent right L4 flexed rotated and side bent left Sacrum right on right     Assessment and Plan:  Right hip pain Continue to work on the hip flexor.  We discussed about the hamstring tendinopathy on the left side and we will see if the patient would be a candidate for the possibility of shockwave which did very well on her wrist.  Discussed with patient about the chronic tendinopathy.  Increase activity slowly.  Discussed icing regimen.  Follow-up again in 6 to 12 weeks otherwise.    Nonallopathic problems  Decision today to treat with OMT was based on Physical Exam  After verbal consent patient was treated with HVLA, ME, FPR techniques in cervical, rib, thoracic, lumbar, and sacral  areas  Patient tolerated the procedure well with improvement in  symptoms  Patient given exercises, stretches and lifestyle modifications  See medications in patient instructions if given  Patient will follow up in 4-8 weeks    The above documentation has been reviewed and is accurate and complete Karen Hungate M Trevis Eden, DO          Note: This dictation was prepared with Dragon dictation along with smaller phrase technology. Any transcriptional errors that result from this process are  unintentional.            [1]  Allergies Allergen Reactions   Epinephrine     Lightheaded, things spin and start to go dark, rapid HR per patient.   Latex Rash

## 2024-01-30 DIAGNOSIS — L718 Other rosacea: Secondary | ICD-10-CM | POA: Diagnosis not present

## 2024-01-30 DIAGNOSIS — J101 Influenza due to other identified influenza virus with other respiratory manifestations: Secondary | ICD-10-CM | POA: Diagnosis not present

## 2024-01-30 DIAGNOSIS — Z85828 Personal history of other malignant neoplasm of skin: Secondary | ICD-10-CM | POA: Diagnosis not present

## 2024-01-30 DIAGNOSIS — L821 Other seborrheic keratosis: Secondary | ICD-10-CM | POA: Diagnosis not present

## 2024-01-30 DIAGNOSIS — R509 Fever, unspecified: Secondary | ICD-10-CM | POA: Diagnosis not present

## 2024-01-30 DIAGNOSIS — D2261 Melanocytic nevi of right upper limb, including shoulder: Secondary | ICD-10-CM | POA: Diagnosis not present

## 2024-02-02 ENCOUNTER — Encounter: Payer: Self-pay | Admitting: Family Medicine

## 2024-02-02 ENCOUNTER — Ambulatory Visit: Admitting: Family Medicine

## 2024-02-02 VITALS — BP 124/86 | HR 48 | Ht 67.0 in | Wt 172.0 lb

## 2024-02-02 DIAGNOSIS — M25551 Pain in right hip: Secondary | ICD-10-CM

## 2024-02-02 DIAGNOSIS — M9901 Segmental and somatic dysfunction of cervical region: Secondary | ICD-10-CM

## 2024-02-02 DIAGNOSIS — M9904 Segmental and somatic dysfunction of sacral region: Secondary | ICD-10-CM

## 2024-02-02 DIAGNOSIS — M9903 Segmental and somatic dysfunction of lumbar region: Secondary | ICD-10-CM

## 2024-02-02 DIAGNOSIS — M9902 Segmental and somatic dysfunction of thoracic region: Secondary | ICD-10-CM

## 2024-02-02 DIAGNOSIS — M461 Sacroiliitis, not elsewhere classified: Secondary | ICD-10-CM

## 2024-02-02 DIAGNOSIS — M9908 Segmental and somatic dysfunction of rib cage: Secondary | ICD-10-CM

## 2024-02-02 MED ORDER — PREDNISONE 20 MG PO TABS: 40.0000 mg | ORAL_TABLET | Freq: Every day | ORAL | 0 refills | Status: AC

## 2024-02-02 MED ORDER — TIZANIDINE HCL 4 MG PO TABS: 4.0000 mg | ORAL_TABLET | Freq: Every day | ORAL | 0 refills | Status: DC

## 2024-02-02 NOTE — Assessment & Plan Note (Signed)
 Continue to work on the hip flexor.  We discussed about the hamstring tendinopathy on the left side and we will see if the patient would be a candidate for the possibility of shockwave which did very well on her wrist.  Discussed with patient about the chronic tendinopathy.  Increase activity slowly.  Discussed icing regimen.  Follow-up again in 6 to 12 weeks otherwise.

## 2024-02-02 NOTE — Patient Instructions (Signed)
 Good to see you Have a great cruise Prednisone  40mg  for 5 days Zanaflex  4mg   See me in 2-3 months

## 2024-02-02 NOTE — Assessment & Plan Note (Signed)
 Some improvement with the sacroiliac injection but there is some hamstring tendinopathy that patient will be seen for as well.  Follow-up again in 6 to 8 weeks otherwise

## 2024-02-09 DIAGNOSIS — E063 Autoimmune thyroiditis: Secondary | ICD-10-CM | POA: Diagnosis not present

## 2024-02-27 ENCOUNTER — Ambulatory Visit: Admitting: Family Medicine

## 2024-03-02 ENCOUNTER — Other Ambulatory Visit: Payer: Self-pay | Admitting: Family Medicine

## 2024-03-04 NOTE — Progress Notes (Unsigned)
 "        I, Karen Short am a scribe for Dr. Artist Lloyd, MD.  Karen Short is a 59 y.o. female who presents to [facility] today for ECSWT consult treatment for her left hip pain. Pt was last seen by Dr. Claudene on 02/02/24 for OMT.  Today, pt reports left hamstring tendonous.  Pain is located left posterior hip into the proximal hamstring area.  Pain worse with activity better with rest.  Dx testing: 12/26/23 R hip MRI-a  12/11/23 R hip XR  Pertinent review of systems: No fevers or chills  Relevant historical information: Heart disease   Exam:  BP 108/60   Pulse 96   Ht 5' 7 (1.702 m)   Wt 173 lb 12.8 oz (78.8 kg)   SpO2 99%   BMI 27.22 kg/m  General: Well Developed, well nourished, and in no acute distress.   MSK: Left posterior hip normal-appearing tender palpation ischial tuberosity.    Lab and Radiology Results  MR HIP WITH IV CONTRAST RIGHT   COMPARISON: None.   CLINICAL HISTORY: Right hip pain with catching.   PULSE SEQUENCES: AX T1, Ax T2 FS, Cor T1, COR STIR & SMALL FOV COR PD FS following intra-articular placement of dilute gadolinium based contrast.   FINDINGS:   Bones and labrum: There is mild degenerative arthrosis without accelerated arthrosis. No full-thickness cartilage defect or subchondral reactive edema is identified. There is fraying and mild irregularity of the superior and anterior labrum. No displaced labral tear is appreciated. The pelvic ring, sacrum and SI joints are unremarkable so far as visualized.   Musculotendinous structures: There is mild insertional tendinosis of the gluteus medius and minimus tendons. There is edema in the quadratus femoris muscle on the right which can be seen with ischiofemoral impingement. Clinical correlation. Otherwise, evaluation of the musculotendinous structures is slightly limited. The sciatic nerve is not well evaluated. Incidental note is made of mild to moderate  tendinosis in the contralateral left hamstring origin with mild reactive edema in the ischial tuberosity. No tear is present.   Multiple uterine fibroids are present. There is likely physiologic free fluid in the pelvis.   IMPRESSION: Mild degenerative changes in the right hip without accelerated arthrosis. There is mild irregularity and fraying of the superior and anterior labrum without a displaced labral tear. This is likely degenerative.   Very mild tendinosis at the insertion of the gluteus medius and minimus tendons. There is tendinosis in the contralateral left hamstring tendon origin. Clinical correlation.   There is edema in the quadratus femoris muscle on the right which can be seen with ischiofemoral impingement. Clinical correlation recommended.   Electronically signed by: Norleen Satchel MD 12/28/2023 08:11 AM EST RP Workstation: [id number] LILLETTE Artist Lloyd, personally (independently) visualized and performed the interpretation of the images attached in this note.                 Extracorporeal Shockwave Therapy Note    Patient is being treated today with ECSWT. Informed consent was obtained and patient tolerated procedure well.   Therapy performed by Artist Lloyd  Condition treated: Left hamstring Treatment preset used: Tendinitis Energy used: 120 mJ Frequency used: 15 Hz Number of pulses: 2000 Head Size: Medium Treatment #1 of #3-4      Assessment and Plan: 59 y.o. female with left hamstring tendinitis.  Based on MRI findings her physical exam and history do match imaging.  After discussion we will do  a trial of shockwave.  Trial shockwave was tolerable we will proceed with shockwave for an estimated 3-4 sessions. No charge for shockwave today.  PDMP not reviewed this encounter. No orders of the defined types were placed in this encounter.  No orders of the defined types were placed in this encounter.    Discussed warning signs or symptoms. Please see  discharge instructions. Patient expresses understanding.   The above documentation has been reviewed and is accurate and complete Artist Lloyd, M.D.   "

## 2024-03-05 ENCOUNTER — Ambulatory Visit: Admitting: Family Medicine

## 2024-03-05 VITALS — BP 108/60 | HR 96 | Ht 67.0 in | Wt 173.8 lb

## 2024-03-05 DIAGNOSIS — M76892 Other specified enthesopathies of left lower limb, excluding foot: Secondary | ICD-10-CM | POA: Diagnosis not present

## 2024-03-05 NOTE — Patient Instructions (Signed)
 Thank you for coming in today.   [email or web address]  The [place] L Protocol for Hamstring Strains  Medford Louder PT  See you next time.

## 2024-03-15 ENCOUNTER — Ambulatory Visit: Admitting: Family Medicine

## 2024-03-22 ENCOUNTER — Ambulatory Visit: Admitting: Family Medicine

## 2024-04-01 ENCOUNTER — Ambulatory Visit: Admitting: Family Medicine

## 2024-04-06 ENCOUNTER — Ambulatory Visit: Admitting: Family Medicine

## 2024-04-12 ENCOUNTER — Ambulatory Visit: Admitting: Family Medicine
